# Patient Record
Sex: Female | Born: 1985 | Hispanic: No | Marital: Married | Smoking: Former smoker
Health system: Southern US, Community
[De-identification: ages and names within clinical notes are randomized; demographics above are authoritative.]

## PROBLEM LIST (undated history)

## (undated) DIAGNOSIS — Z789 Other specified health status: Secondary | ICD-10-CM

## (undated) DIAGNOSIS — L709 Acne, unspecified: Secondary | ICD-10-CM

## (undated) DIAGNOSIS — O1403 Mild to moderate pre-eclampsia, third trimester: Secondary | ICD-10-CM

## (undated) HISTORY — DX: Acne, unspecified: L70.9

## (undated) HISTORY — PX: NO PAST SURGERIES: SHX2092

## (undated) HISTORY — PX: WISDOM TOOTH EXTRACTION: SHX21

---

## 2017-08-18 LAB — OB RESULTS CONSOLE ABO/RH: RH Type: POSITIVE

## 2017-08-18 LAB — OB RESULTS CONSOLE RUBELLA ANTIBODY, IGM: RUBELLA: IMMUNE

## 2017-08-18 LAB — OB RESULTS CONSOLE GC/CHLAMYDIA
CHLAMYDIA, DNA PROBE: NEGATIVE
GC PROBE AMP, GENITAL: NEGATIVE

## 2017-08-18 LAB — OB RESULTS CONSOLE HIV ANTIBODY (ROUTINE TESTING): HIV: NONREACTIVE

## 2017-08-18 LAB — OB RESULTS CONSOLE ANTIBODY SCREEN: Antibody Screen: NEGATIVE

## 2017-08-18 LAB — OB RESULTS CONSOLE RPR: RPR: NONREACTIVE

## 2017-08-18 LAB — OB RESULTS CONSOLE HEPATITIS B SURFACE ANTIGEN: HEP B S AG: NEGATIVE

## 2017-09-08 ENCOUNTER — Other Ambulatory Visit (HOSPITAL_COMMUNITY): Payer: Self-pay | Admitting: Obstetrics & Gynecology

## 2017-09-08 DIAGNOSIS — Q798 Other congenital malformations of musculoskeletal system: Secondary | ICD-10-CM

## 2017-09-08 DIAGNOSIS — Z3689 Encounter for other specified antenatal screening: Secondary | ICD-10-CM

## 2017-09-08 DIAGNOSIS — Z3A15 15 weeks gestation of pregnancy: Secondary | ICD-10-CM

## 2017-09-20 ENCOUNTER — Encounter (HOSPITAL_COMMUNITY): Payer: Self-pay | Admitting: Obstetrics & Gynecology

## 2017-09-28 ENCOUNTER — Ambulatory Visit (HOSPITAL_COMMUNITY)
Admission: RE | Admit: 2017-09-28 | Discharge: 2017-09-28 | Disposition: A | Discharge status: Home / Self Care | Payer: BLUE CROSS/BLUE SHIELD | Source: Ambulatory Visit | Attending: Obstetrics & Gynecology | Admitting: Obstetrics & Gynecology

## 2017-09-28 ENCOUNTER — Encounter (HOSPITAL_COMMUNITY): Payer: Self-pay | Admitting: *Deleted

## 2017-09-28 DIAGNOSIS — Z3A16 16 weeks gestation of pregnancy: Secondary | ICD-10-CM | POA: Diagnosis not present

## 2017-09-28 DIAGNOSIS — O418X20 Other specified disorders of amniotic fluid and membranes, second trimester, not applicable or unspecified: Secondary | ICD-10-CM | POA: Diagnosis not present

## 2017-09-28 DIAGNOSIS — Z3A15 15 weeks gestation of pregnancy: Secondary | ICD-10-CM

## 2017-09-28 DIAGNOSIS — Z3689 Encounter for other specified antenatal screening: Secondary | ICD-10-CM

## 2017-09-28 DIAGNOSIS — Z363 Encounter for antenatal screening for malformations: Secondary | ICD-10-CM | POA: Insufficient documentation

## 2017-09-28 DIAGNOSIS — O418X90 Other specified disorders of amniotic fluid and membranes, unspecified trimester, not applicable or unspecified: Secondary | ICD-10-CM | POA: Diagnosis present

## 2017-09-28 DIAGNOSIS — Q798 Other congenital malformations of musculoskeletal system: Secondary | ICD-10-CM

## 2017-09-28 HISTORY — DX: Other specified health status: Z78.9

## 2017-10-05 ENCOUNTER — Other Ambulatory Visit: Payer: Self-pay

## 2017-10-05 ENCOUNTER — Encounter (HOSPITAL_COMMUNITY): Payer: Self-pay

## 2017-11-22 NOTE — L&D Delivery Note (Signed)
Operative Delivery Note Pushing well but not able to get past +3/5.  Verbal consent: obtained from patient for Vacuum assisted vaginal birth.  Risks and benefits discussed in detail.  Risks include, but are not limited to the risks of anesthesia, bleeding, infection, damage to maternal tissues, fetal cephalhematoma.  There is also the risk of inability to effect vaginal delivery of the head, or shoulder dystocia that cannot be resolved by established maneuvers, leading to the need for emergency cesarean section.  Kiwi used. 2 applications. 1st with 4 pushes and 2nd with 2 pushes and completed head delivery. No pop-offs.  At 2:21 AM a healthy viable female was delivered via Vaginal, Kiwi Vacuum Investment banker, operational(Extractor).  Presentation: vertex; Position: Occiput,, Anterior; Station: +3. Vacum released and baby placed on mother. Delayed cord clamping done at 1 minute.   APGAR: 9, 9; weight -pending  .   Placenta status: spontaneous, complete , .   Cord:  with the following complications: None  .  Cord pH: not sent.   Anesthesia:  Epidural  Instruments: Kiwi Episiotomy: Left Mediolateral Lacerations: 2nd degree Suture Repair: 3.0 vicryl rapide Est. Blood Loss (mL):  600 cc  Mom to postpartum.  Baby to Couplet care / Skin to Skin.  Sarah Hodge 02/26/2018, 3:00 AM

## 2018-02-14 LAB — OB RESULTS CONSOLE GBS: GBS: NEGATIVE

## 2018-02-24 ENCOUNTER — Inpatient Hospital Stay (HOSPITAL_COMMUNITY)
Admission: AD | Admit: 2018-02-24 | Discharge: 2018-02-28 | DRG: 806 | Disposition: A | Discharge status: Home / Self Care | Payer: BLUE CROSS/BLUE SHIELD | Source: Ambulatory Visit | Attending: Obstetrics & Gynecology | Admitting: Obstetrics & Gynecology

## 2018-02-24 ENCOUNTER — Encounter (HOSPITAL_COMMUNITY): Payer: Self-pay | Admitting: Obstetrics & Gynecology

## 2018-02-24 DIAGNOSIS — Z8759 Personal history of other complications of pregnancy, childbirth and the puerperium: Secondary | ICD-10-CM

## 2018-02-24 DIAGNOSIS — O1403 Mild to moderate pre-eclampsia, third trimester: Secondary | ICD-10-CM

## 2018-02-24 DIAGNOSIS — O9081 Anemia of the puerperium: Secondary | ICD-10-CM | POA: Diagnosis not present

## 2018-02-24 DIAGNOSIS — O1404 Mild to moderate pre-eclampsia, complicating childbirth: Secondary | ICD-10-CM | POA: Diagnosis present

## 2018-02-24 DIAGNOSIS — Z3A37 37 weeks gestation of pregnancy: Secondary | ICD-10-CM

## 2018-02-24 DIAGNOSIS — D62 Acute posthemorrhagic anemia: Secondary | ICD-10-CM | POA: Diagnosis not present

## 2018-02-24 HISTORY — DX: Mild to moderate pre-eclampsia, third trimester: O14.03

## 2018-02-24 LAB — CBC
HEMATOCRIT: 38 % (ref 36.0–46.0)
HEMOGLOBIN: 12.6 g/dL (ref 12.0–15.0)
MCH: 27 pg (ref 26.0–34.0)
MCHC: 33.2 g/dL (ref 30.0–36.0)
MCV: 81.4 fL (ref 78.0–100.0)
Platelets: 193 10*3/uL (ref 150–400)
RBC: 4.67 MIL/uL (ref 3.87–5.11)
RDW: 14.9 % (ref 11.5–15.5)
WBC: 11.9 10*3/uL — AB (ref 4.0–10.5)

## 2018-02-24 LAB — COMPREHENSIVE METABOLIC PANEL
ALBUMIN: 2.7 g/dL — AB (ref 3.5–5.0)
ALT: 12 U/L — ABNORMAL LOW (ref 14–54)
ANION GAP: 10 (ref 5–15)
AST: 19 U/L (ref 15–41)
Alkaline Phosphatase: 208 U/L — ABNORMAL HIGH (ref 38–126)
BUN: 8 mg/dL (ref 6–20)
CO2: 18 mmol/L — ABNORMAL LOW (ref 22–32)
Calcium: 8.7 mg/dL — ABNORMAL LOW (ref 8.9–10.3)
Chloride: 106 mmol/L (ref 101–111)
Creatinine, Ser: 0.47 mg/dL (ref 0.44–1.00)
GFR calc Af Amer: >60 mL/min (ref >60)
GFR calc non Af Amer: >60 mL/min (ref >60)
GLUCOSE: 106 mg/dL — AB (ref 65–99)
POTASSIUM: 4.1 mmol/L (ref 3.5–5.1)
Sodium: 134 mmol/L — ABNORMAL LOW (ref 135–145)
Total Bilirubin: 0.4 mg/dL (ref 0.3–1.2)
Total Protein: 6.6 g/dL (ref 6.5–8.1)

## 2018-02-24 LAB — ABO/RH: ABO/RH(D): B POS

## 2018-02-24 LAB — TYPE AND SCREEN
ABO/RH(D): B POS
ANTIBODY SCREEN: NEGATIVE

## 2018-02-24 MED ORDER — LACTATED RINGERS IV SOLN
INTRAVENOUS | Status: DC
  Administered 2018-02-24 (×2): via INTRAVENOUS
  Administered 2018-02-25: 200 mL via INTRAVENOUS
  Administered 2018-02-25 (×3): via INTRAVENOUS

## 2018-02-24 MED ORDER — MISOPROSTOL 25 MCG QUARTER TABLET
25.0000 ug | ORAL_TABLET | ORAL | Status: AC | PRN
  Administered 2018-02-24 (×2): 25 ug via VAGINAL
  Filled 2018-02-24 (×2): qty 1

## 2018-02-24 MED ORDER — SOD CITRATE-CITRIC ACID 500-334 MG/5ML PO SOLN: 30.0000 mL | ORAL | Status: DC | PRN

## 2018-02-24 MED ORDER — ACETAMINOPHEN 325 MG PO TABS: 650.0000 mg | ORAL_TABLET | ORAL | Status: DC | PRN

## 2018-02-24 MED ORDER — OXYTOCIN 40 UNITS IN LACTATED RINGERS INFUSION - SIMPLE MED: 2.5000 [IU]/h | INTRAVENOUS | Status: DC

## 2018-02-24 MED ORDER — TERBUTALINE SULFATE 1 MG/ML IJ SOLN
0.2500 mg | Freq: Once | INTRAMUSCULAR | Status: DC | PRN
  Filled 2018-02-24: qty 1

## 2018-02-24 MED ORDER — OXYCODONE-ACETAMINOPHEN 5-325 MG PO TABS: 1.0000 | ORAL_TABLET | ORAL | Status: DC | PRN

## 2018-02-24 MED ORDER — BUTORPHANOL TARTRATE 1 MG/ML IJ SOLN
1.0000 mg | INTRAMUSCULAR | Status: DC | PRN
  Administered 2018-02-24: 1 mg via INTRAVENOUS
  Filled 2018-02-24: qty 1

## 2018-02-24 MED ORDER — LIDOCAINE HCL (PF) 1 % IJ SOLN
30.0000 mL | INTRAMUSCULAR | Status: AC | PRN
  Administered 2018-02-26: 30 mL via SUBCUTANEOUS
  Filled 2018-02-24: qty 30

## 2018-02-24 MED ORDER — LACTATED RINGERS IV SOLN: 500.0000 mL | INTRAVENOUS | Status: DC | PRN

## 2018-02-24 MED ORDER — OXYCODONE-ACETAMINOPHEN 5-325 MG PO TABS: 2.0000 | ORAL_TABLET | ORAL | Status: DC | PRN

## 2018-02-24 MED ORDER — ZOLPIDEM TARTRATE 5 MG PO TABS
5.0000 mg | ORAL_TABLET | Freq: Every evening | ORAL | Status: DC | PRN
  Administered 2018-02-24: 5 mg via ORAL
  Filled 2018-02-24: qty 1

## 2018-02-24 MED ORDER — OXYTOCIN BOLUS FROM INFUSION: 500.0000 mL | Freq: Once | INTRAVENOUS | Status: DC

## 2018-02-24 MED ORDER — OXYTOCIN 40 UNITS IN LACTATED RINGERS INFUSION - SIMPLE MED
1.0000 m[IU]/min | INTRAVENOUS | Status: DC
  Administered 2018-02-25: 2 m[IU]/min via INTRAVENOUS
  Filled 2018-02-24: qty 1000

## 2018-02-24 MED ORDER — ONDANSETRON HCL 4 MG/2ML IJ SOLN: 4.0000 mg | Freq: Four times a day (QID) | INTRAMUSCULAR | Status: DC | PRN

## 2018-02-24 NOTE — H&P (Signed)
Sarah Hodge is a 32 y.o. female G1, 37.5 wks by 1st trim sono. Admitted for labor induction for mild preeclampsia.  BP elevated this wk, 140s/ 90s, facial and had swelling. No HAs or RUQ/epig pain, wt is stable. PIH labs- normal but Urine p/c ratio 0.43 in office yesterday. Since BP still elevated today. Recommend IOL and agrees.   Overall uncomplicated pregnancy except amniotic band noted at 13 wk sono, she had normal anatomy, normal MFM sono and consult and normal growth at 34 wks - 5'13" 66%, BPD 92%, FL 82%, AC 42%   OB History    Gravida  1   Para  0   Term  0   Preterm  0   AB  0   Living  0     SAB  0   TAB  0   Ectopic  0   Multiple  0   Live Births  0          Past Medical History:  Diagnosis Date  . Antepartum mild preeclampsia, third trimester 02/24/2018  . Medical history non-contributory    Past Surgical History:  Procedure Laterality Date  . NO PAST SURGERIES     Family History: family history is not on file. Social History:  reports that she has never smoked. She has never used smokeless tobacco. She reports that she does not drink alcohol or use drugs.     Maternal Diabetes: No Genetic Screening: Normal  Ultrascreen neg, AFP1 normal  Maternal Ultrasounds/Referrals: Normal anatomy, normal growth at 34 wks, Amniotic band noted at 13 wks was not seen at MFM sono in 2nd trimester Fetal Ultrasounds or other Referrals:  Referred to Materal Fetal Medicine  Maternal Substance Abuse:  No Significant Maternal Medications:  None Significant Maternal Lab Results:  Lab values include: Group B Strep negative Other Comments:  None  ROSswelling. No HAs History   Blood pressure (!) 143/96, pulse 97, last menstrual period 05/31/2017. Exam Physical Exam  Physical exam:  A&O x 3, no acute distress. Pleasant HEENT neg, no thyromegaly Lungs CTA bilat CV RRR, S1S2 normal Abdo soft, non tender, non acute Extr no edema/ tenderness DTR +2 Pelvic closed, long  cx, Vx, high station -4 FHT 135/ + accels/ no decels/ mod variab - cat I Toco none  Prenatal labs: ABO, Rh: B/Positive/-- (09/27 0000) Antibody: Negative (09/27 0000) Rubella: Immune (09/27 0000) RPR: Nonreactive (09/27 0000)  HBsAg: Negative (09/27 0000)  HIV: Non-reactive (09/27 0000)  GBS: Negative (03/26 0000)   Assessment/Plan: 32 yo G1, 37.5 wks, mild PEC. Admitted for IOL, cytotec, 2 doses every 4 hrs as needed. Ok to ambulate, general diet now.  FHT- category I  Labor pain mngmt reviewed, desires epidural in active labor.  PIH labs.  Sarah Hodge 02/24/2018, 1:01 PM

## 2018-02-24 NOTE — Progress Notes (Signed)
Patient ID: [name], female   DOB: 1986/02/03, 32 y.o.   MRN: [id number] IOL 37/5 wks for mild preeclampsia. Labs nl, urine p/c in office 0.43 yesterday S/p Cytotec 25mcg vaginal 2 doses.   BP (!) 130/95   Pulse (!) 101   Temp 98.2 F (36.8 C)   Resp 17   Ht 5\' 8"  (1.727 m)   Wt 189 lb (85.7 kg)   LMP 05/31/2017   SpO2 97%   BMI 28.74 kg/m   FHT 135/ + accels/ no decels/ mod variab- cat I UCs   2-3 mins   SVE 2/ 50%./ -5. Vx.  Pelvis? Borderline   Cervical Foley placed, 30 cc balloon. Tolerated well.   Overnight Ambien, stadol as needed for pain. Epidural ok  Clear liquid once starts to get painful contractions.

## 2018-02-24 NOTE — Anesthesia Pain Management Evaluation Note (Signed)
  CRNA Pain Management Visit Note  Patient: Sarah Hodge, 32 y.o., female  "Hello I am a member of the anesthesia team at [facility]. We have an anesthesia team available at all times to provide care throughout the hospital, including epidural management and anesthesia for C-section. I don't know your plan for the delivery whether it a natural birth, water birth, IV sedation, nitrous supplementation, doula or epidural, but we want to meet your pain goals."   1.Was your pain managed to your expectations on prior hospitalizations?   Yes   2.What is your expectation for pain management during this hospitalization?     Epidural  3.How can we help you reach that goal? Support prn  Record the patient's initial score and the patient's pain goal.   Pain: 0  Pain Goal: 4 [facility] wants you to be able to say your pain was always managed very well.  [facility],Elder Davidian 02/24/2018

## 2018-02-25 ENCOUNTER — Inpatient Hospital Stay (HOSPITAL_COMMUNITY): Payer: BLUE CROSS/BLUE SHIELD | Admitting: Anesthesiology

## 2018-02-25 ENCOUNTER — Encounter (HOSPITAL_COMMUNITY): Payer: Self-pay | Admitting: Emergency Medicine

## 2018-02-25 LAB — RPR: RPR Ser Ql: NONREACTIVE

## 2018-02-25 LAB — CBC
HCT: 37.7 % (ref 36.0–46.0)
HCT: 38.8 % (ref 36.0–46.0)
Hemoglobin: 12.4 g/dL (ref 12.0–15.0)
Hemoglobin: 12.9 g/dL (ref 12.0–15.0)
MCH: 26.7 pg (ref 26.0–34.0)
MCH: 27 pg (ref 26.0–34.0)
MCHC: 32.9 g/dL (ref 30.0–36.0)
MCHC: 33.2 g/dL (ref 30.0–36.0)
MCV: 81.3 fL (ref 78.0–100.0)
MCV: 81.3 fL (ref 78.0–100.0)
PLATELETS: 178 10*3/uL (ref 150–400)
Platelets: 165 10*3/uL (ref 150–400)
RBC: 4.64 MIL/uL (ref 3.87–5.11)
RBC: 4.77 MIL/uL (ref 3.87–5.11)
RDW: 15 % (ref 11.5–15.5)
RDW: 14.9 % (ref 11.5–15.5)
WBC: 13.6 10*3/uL — ABNORMAL HIGH (ref 4.0–10.5)
WBC: 14.1 10*3/uL — AB (ref 4.0–10.5)

## 2018-02-25 MED ORDER — LACTATED RINGERS IV SOLN
500.0000 mL | Freq: Once | INTRAVENOUS | Status: AC
  Administered 2018-02-25: 500 mL via INTRAVENOUS

## 2018-02-25 MED ORDER — EPHEDRINE 5 MG/ML INJ
10.0000 mg | INTRAVENOUS | Status: DC | PRN
  Filled 2018-02-25: qty 2

## 2018-02-25 MED ORDER — PHENYLEPHRINE 40 MCG/ML (10ML) SYRINGE FOR IV PUSH (FOR BLOOD PRESSURE SUPPORT)
80.0000 ug | PREFILLED_SYRINGE | INTRAVENOUS | Status: DC | PRN
  Filled 2018-02-25: qty 10
  Filled 2018-02-25: qty 5

## 2018-02-25 MED ORDER — PHENYLEPHRINE 40 MCG/ML (10ML) SYRINGE FOR IV PUSH (FOR BLOOD PRESSURE SUPPORT)
80.0000 ug | PREFILLED_SYRINGE | INTRAVENOUS | Status: DC | PRN
  Filled 2018-02-25: qty 5

## 2018-02-25 MED ORDER — LIDOCAINE HCL (PF) 1 % IJ SOLN
INTRAMUSCULAR | Status: DC | PRN
  Administered 2018-02-25 (×2): 6 mL via EPIDURAL

## 2018-02-25 MED ORDER — LIDOCAINE-EPINEPHRINE (PF) 2 %-1:200000 IJ SOLN
INTRAMUSCULAR | Status: DC | PRN
  Administered 2018-02-25: 5 mL via EPIDURAL
  Administered 2018-02-25: 2 mL via EPIDURAL

## 2018-02-25 MED ORDER — FENTANYL 2.5 MCG/ML BUPIVACAINE 1/10 % EPIDURAL INFUSION (WH - ANES)
14.0000 mL/h | INTRAMUSCULAR | Status: DC | PRN
  Administered 2018-02-25 (×2): 14 mL/h via EPIDURAL
  Filled 2018-02-25 (×2): qty 100

## 2018-02-25 MED ORDER — DIPHENHYDRAMINE HCL 50 MG/ML IJ SOLN: 12.5000 mg | INTRAMUSCULAR | Status: DC | PRN

## 2018-02-25 NOTE — Progress Notes (Signed)
Patient ID: Sarah MortSonam Hodge, female   DOB: 22-Apr-1986, 32 y.o.   MRN: [id number]  Contractions slightly stronger. May request epidural   BP (!) 148/101   Pulse (!) 103   Temp 98.5 F (36.9 C) (Oral)   Resp 18   Ht 5\' 8"  (1.727 m)   Wt 189 lb (85.7 kg)   LMP 05/31/2017   SpO2 97%   BMI 28.74 kg/m   FHT 140s/ + accels/ no decels/ mod variability-catergoryI  Toco  2-3 min, now on pitocin  Posterior cervix, cant reach os, station higher (ballotable now, it was not ballotable when cervical balloon was placed)   Epidural per request . Will attempt AROM again if head well applied or lower   V.Juliene PinaMody, MD

## 2018-02-25 NOTE — Progress Notes (Signed)
Patient ID: Sarah Hodge, female   DOB: 1985-12-25, 32 y.o.   MRN: [id number]  BP (!) 145/98   Pulse 100   Temp 98.2 F (36.8 C) (Axillary)   Resp 18   Ht 5\' 8"  (1.727 m)   Wt 189 lb (85.7 kg)   LMP 05/31/2017   SpO2 97%   BMI 28.74 kg/m  FHT 140s/ + accels/ no decels/ mod variability-catergoryI  Toco   2-3 min, now on pitocin  Cervical balloon expelled, 4 cn/ 50%/ high station per RN CBC now. Epidural per request

## 2018-02-25 NOTE — Progress Notes (Signed)
Sarah Hodge is a 32 y.o. G1P0000 at 339w6d by 1st trim ultrasound admitted for IOL for Mild Preeclampsia   Subjective: Feels well with epidural   Objective: BP (!) 147/97   Pulse (!) 106   Temp 98 F (36.7 C) (Oral)   Resp 18   Ht 5\' 8"  (1.727 m)   Wt 189 lb (85.7 kg)   LMP 05/31/2017   SpO2 97%   BMI 28.74 kg/m    FHT:  FHR: 145 bpm, variability: moderate,  accelerations:  Present,  decelerations:  Absent UC:   regular, every 3 minutes SVE:   Dilation: 5.5 Effacement (%): 60 Station: Ballotable Exam by:: Sarah Hodge Controlled AROM, clear large amount of fluid. Once fluid released, head was well approximated to cervix.   Labs: Lab Results  Component Value Date   WBC 14.1 (H) 02/25/2018   HGB 12.9 02/25/2018   HCT 38.8 02/25/2018   MCV 81.3 02/25/2018   PLT 178 02/25/2018    Assessment / Plan: Induction of labor due to preeclampsia,  progressing well on pitocin  Labor: Progressing normally though fetal descent not present  Preeclampsia:  no signs or symptoms of toxicity, intake and ouput balanced, labs stable and will give magnesium pp Fetal Wellbeing:  Category I Pain Control:  Epidural I/D:  n/a Anticipated MOD:  guarded, working towards vaginal delivery  Sarah Hodge 02/25/2018, 1:17 PM

## 2018-02-25 NOTE — Anesthesia Procedure Notes (Signed)
Epidural Patient location during procedure: OB Start time: 02/25/2018 11:14 AM End time: 02/25/2018 11:17 AM  Staffing Anesthesiologist: Leilani AbleHatchett, Zymarion Favorite, MD Performed: anesthesiologist   Preanesthetic Checklist Completed: patient identified, site marked, surgical consent, pre-op evaluation, timeout performed, IV checked, risks and benefits discussed and monitors and equipment checked  Epidural Patient position: sitting Prep: site prepped and draped and DuraPrep Patient monitoring: continuous pulse ox and blood pressure Approach: midline Location: L3-L4 Injection technique: LOR air  Needle:  Needle type: Tuohy  Needle gauge: 17 G Needle length: 9 cm and 9 Needle insertion depth: 5 cm cm Catheter type: closed end flexible Catheter size: 19 Gauge Catheter at skin depth: 10 cm Test dose: negative and Other  Assessment Sensory level: T9 Events: blood not aspirated, injection not painful, no injection resistance, negative IV test and no paresthesia  Additional Notes Reason for block:procedure for pain

## 2018-02-25 NOTE — Progress Notes (Signed)
Sarah Hodge is a 32 y.o. G1P0000 at 5538w6d by 1st trim ultrasound admitted for IOL for Mild Preeclampsia   Subjective: Feels well but tired now since in house since 12 noon yesterday. Lengthy IOL process was reviewed and d/w pt again.  Denies HA/ vision issues/ epig pain  Objective: BP 134/84   Pulse (!) 111   Temp 99.3 F (37.4 C) (Axillary)   Resp 20   Ht 5\' 8"  (1.727 m)   Wt 189 lb (85.7 kg)   LMP 05/31/2017   SpO2 97%   BMI 28.74 kg/m  BPs stable FHT:  FHR: 145 bpm, variability: moderate,  accelerations:  Present,  decelerations:  Absent UC:   regular, every 3 minutes SVE:   Dilation: 8 Effacement (%): 100 Station: -3 Exam by:: Jerrol Helmers MD Head was well approximated to cervix.  Continue to closely reassess but there is improvement   Assessment / Plan: Induction of labor due to preeclampsia,  progressing well on pitocin  Labor: Progressing normally. Hoping for fetal descent as she dilates further. IUPC deferred since making good progress.  Preeclampsia:  no signs or symptoms of toxicity, intake and ouput balanced, labs stable and will give magnesium pp Fetal Wellbeing:  Category I Pain Control:  Epidural I/D:  n/a Anticipated MOD:  guarded, working towards vaginal delivery  Sarah Hodge 02/25/2018, 9:06 PM

## 2018-02-25 NOTE — Progress Notes (Signed)
Patient ID: [name], female   DOB: [birth year], 32 y.o.   MRN: [id number] CTSP for pain on right side, epidural PCA not helping anymore  SVE complete/ Vx/-2  Feels tight fit.   Reduce pitocin, anesthesia rebolus if option and reassess

## 2018-02-25 NOTE — Anesthesia Preprocedure Evaluation (Signed)
Anesthesia Evaluation  Patient identified by MRN, date of birth, ID band Patient awake    Reviewed: Allergy & Precautions, H&P , NPO status , Patient's Chart, lab work & pertinent test results  Airway Mallampati: I  TM Distance: >3 FB Neck ROM: full    Dental no notable dental hx. (+) Teeth Intact   Pulmonary neg pulmonary ROS,    Pulmonary exam normal breath sounds clear to auscultation       Cardiovascular hypertension, negative cardio ROS Normal cardiovascular exam Rhythm:regular Rate:Normal     Neuro/Psych negative neurological ROS  negative psych ROS   GI/Hepatic negative GI ROS, Neg liver ROS,   Endo/Other  negative endocrine ROS  Renal/GU negative Renal ROS  negative genitourinary   Musculoskeletal negative musculoskeletal ROS (+)   Abdominal Normal abdominal exam  (+)   Peds  Hematology negative hematology ROS (+)   Anesthesia Other Findings   Reproductive/Obstetrics (+) Pregnancy                             Anesthesia Physical Anesthesia Plan  ASA: II  Anesthesia Plan: Epidural   Post-op Pain Management:    Induction:   PONV Risk Score and Plan:   Airway Management Planned:   Additional Equipment:   Intra-op Plan:   Post-operative Plan:   Informed Consent: I have reviewed the patients History and Physical, chart, labs and discussed the procedure including the risks, benefits and alternatives for the proposed anesthesia with the patient or authorized representative who has indicated his/her understanding and acceptance.     Plan Discussed with:   Anesthesia Plan Comments:         Anesthesia Quick Evaluation

## 2018-02-26 ENCOUNTER — Encounter (HOSPITAL_COMMUNITY): Payer: Self-pay | Admitting: *Deleted

## 2018-02-26 ENCOUNTER — Other Ambulatory Visit: Payer: Self-pay

## 2018-02-26 DIAGNOSIS — Z8759 Personal history of other complications of pregnancy, childbirth and the puerperium: Secondary | ICD-10-CM

## 2018-02-26 LAB — CBC
HEMATOCRIT: 36.2 % (ref 36.0–46.0)
HEMOGLOBIN: 12.1 g/dL (ref 12.0–15.0)
MCH: 26.9 pg (ref 26.0–34.0)
MCHC: 33.4 g/dL (ref 30.0–36.0)
MCV: 80.6 fL (ref 78.0–100.0)
Platelets: 184 10*3/uL (ref 150–400)
RBC: 4.49 MIL/uL (ref 3.87–5.11)
RDW: 14.8 % (ref 11.5–15.5)
WBC: 21.4 10*3/uL — AB (ref 4.0–10.5)

## 2018-02-26 MED ORDER — FERROUS SULFATE 325 (65 FE) MG PO TABS
325.0000 mg | ORAL_TABLET | Freq: Two times a day (BID) | ORAL | Status: DC
Start: 2018-02-26 — End: 2018-02-28
  Administered 2018-02-26 – 2018-02-28 (×5): 325 mg via ORAL
  Filled 2018-02-26 (×5): qty 1

## 2018-02-26 MED ORDER — COCONUT OIL OIL
1.0000 "application " | TOPICAL_OIL | Status: DC | PRN
  Administered 2018-02-28: 1 via TOPICAL
  Filled 2018-02-26: qty 120

## 2018-02-26 MED ORDER — OXYCODONE-ACETAMINOPHEN 5-325 MG PO TABS: 1.0000 | ORAL_TABLET | ORAL | Status: DC | PRN

## 2018-02-26 MED ORDER — LACTATED RINGERS IV SOLN
INTRAVENOUS | Status: DC
  Administered 2018-02-26 – 2018-02-27 (×3): via INTRAVENOUS

## 2018-02-26 MED ORDER — SENNOSIDES-DOCUSATE SODIUM 8.6-50 MG PO TABS
2.0000 | ORAL_TABLET | ORAL | Status: DC
  Administered 2018-02-27 (×2): 2 via ORAL
  Filled 2018-02-26 (×2): qty 2

## 2018-02-26 MED ORDER — ONDANSETRON HCL 4 MG/2ML IJ SOLN: 4.0000 mg | INTRAMUSCULAR | Status: DC | PRN

## 2018-02-26 MED ORDER — MAGNESIUM SULFATE BOLUS VIA INFUSION
4.0000 g | Freq: Once | INTRAVENOUS | Status: AC
  Administered 2018-02-26: 4 g via INTRAVENOUS
  Filled 2018-02-26: qty 500

## 2018-02-26 MED ORDER — TETANUS-DIPHTH-ACELL PERTUSSIS 5-2.5-18.5 LF-MCG/0.5 IM SUSP: 0.5000 mL | Freq: Once | INTRAMUSCULAR | Status: DC

## 2018-02-26 MED ORDER — SIMETHICONE 80 MG PO CHEW
80.0000 mg | CHEWABLE_TABLET | ORAL | Status: DC | PRN
Start: 2018-02-26 — End: 2018-02-28

## 2018-02-26 MED ORDER — WITCH HAZEL-GLYCERIN EX PADS
1.0000 | MEDICATED_PAD | CUTANEOUS | Status: DC | PRN
Start: 2018-02-26 — End: 2018-02-28
  Administered 2018-02-26: 1 via TOPICAL

## 2018-02-26 MED ORDER — DIPHENHYDRAMINE HCL 25 MG PO CAPS: 25.0000 mg | ORAL_CAPSULE | Freq: Four times a day (QID) | ORAL | Status: DC | PRN

## 2018-02-26 MED ORDER — IBUPROFEN 600 MG PO TABS
600.0000 mg | ORAL_TABLET | Freq: Four times a day (QID) | ORAL | Status: DC
  Administered 2018-02-26 – 2018-02-28 (×9): 600 mg via ORAL
  Filled 2018-02-26 (×9): qty 1

## 2018-02-26 MED ORDER — PRENATAL MULTIVITAMIN CH
1.0000 | ORAL_TABLET | Freq: Every day | ORAL | Status: DC
  Administered 2018-02-26 – 2018-02-27 (×2): 1 via ORAL
  Filled 2018-02-26 (×2): qty 1

## 2018-02-26 MED ORDER — MAGNESIUM SULFATE 40 G IN LACTATED RINGERS - SIMPLE
2.0000 g/h | INTRAVENOUS | Status: AC
  Administered 2018-02-27: 2 g/h via INTRAVENOUS
  Filled 2018-02-26: qty 500
  Filled 2018-02-26: qty 40

## 2018-02-26 MED ORDER — ACETAMINOPHEN 325 MG PO TABS: 650.0000 mg | ORAL_TABLET | ORAL | Status: DC | PRN

## 2018-02-26 MED ORDER — ONDANSETRON HCL 4 MG PO TABS: 4.0000 mg | ORAL_TABLET | ORAL | Status: DC | PRN

## 2018-02-26 MED ORDER — BENZOCAINE-MENTHOL 20-0.5 % EX AERO
1.0000 "application " | INHALATION_SPRAY | CUTANEOUS | Status: DC | PRN
  Administered 2018-02-26: 1 via TOPICAL
  Filled 2018-02-26: qty 56

## 2018-02-26 MED ORDER — DIBUCAINE 1 % RE OINT
1.0000 "application " | TOPICAL_OINTMENT | RECTAL | Status: DC | PRN
  Administered 2018-02-26: 1 via RECTAL
  Filled 2018-02-26: qty 28

## 2018-02-26 NOTE — Progress Notes (Signed)
   02/26/18 1028 02/26/18 1118 02/26/18 1328  Vital Signs  BP (!) 157/105 (!) 133/99 (!) 149/102  Dr Juliene PinaMody called & report given re: above Bps & times & most recent PIH assessment.  Order's rec'd to transfer pt for magnesium admin.  Discussed POC with pt & husband re indication, action, side effects & frequency of magnesium sulfate IV.  Questions answered & both verb understanding.

## 2018-02-26 NOTE — Lactation Note (Signed)
This note was copied from a baby's chart. Lactation Consultation Note  Patient Name: Sarah Hodge XWRUE'AToday's Date: 02/26/2018 Reason for consult: Initial assessment;Primapara;1st time breastfeeding;Other (Comment)(mom is on MAg SO4 - started at 1540 , )  Baby is 3614 hours old and has been to the breast several times.  As LC entered the room the RN had assisted baby to latch with depth.  LC noted flanged lips, swallows, increased with breast compressions.  Baby fed for 15 mins, nipple well rounded when baby released.  LC mentioned to mom, dad and RN if intermittent pinching continues  To instruct mom on shells. LC also encouraged mom prior to latch - breast  Massage, hand express, reverse pressure.  Mother informed of post-discharge support and given phone number to the lactation department, including services for phone call assistance; out-patient appointments; and breastfeeding support group. List of other breastfeeding resources in the community given in the handout. Encouraged mother to call for problems or concerns related to breastfeeding.   Maternal Data Has patient been taught Hand Expression?: Yes Does the patient have breastfeeding experience prior to this delivery?: No  Feeding Feeding Type: (baby latched as LC entered the room - with depth , swallows noted ) Length of feed: 20 min(swallows noted, increased with breast compressions )  LATCH Score Latch: (latched with depth )  Audible Swallowing: (swallows, increased with breast compressions )  Type of Nipple: (nipple well rounded when baby released )  Comfort (Breast/Nipple): (per mom aliitle pinching intermittent )  Hold (Positioning): (RN assisted with latch )  LATCH Score: 7  Interventions Interventions: Breast feeding basics reviewed;Assisted with latch;Skin to skin;Breast massage;Breast compression;Reverse pressure  Lactation Tools Discussed/Used     Consult Status Consult Status: Follow-up Date:  02/27/18 Follow-up type: In-patient    Sarah Hodge 02/26/2018, 4:29 PM

## 2018-02-26 NOTE — Progress Notes (Signed)
Patient ID: Sarah Hodge, female   DOB: [birth year], 32 y.o.   MRN: [id number] IOL for PEC.  BP (!) 159/84   Pulse (!) 125   Temp 99.7 F (37.6 C) (Oral)   Resp 18   Ht 5\' 8"  (1.727 m)   Wt 189 lb (85.7 kg)   LMP 05/31/2017   SpO2 97%   BMI 28.74 kg/m  BPs overall stable, occ high with pushing, declines symptoms.  Now complete and pushing since 12.15 am. Caput and moulding, +1 +2.  FHT slight baseline change from 145 to 155 but mod variability/ + accels/ no decels - Cat I Toco q 3 min  Small perineal body, possible episiotomy reviewed, will assess when crowning.  If brings brings the station lower, she may be a candidate for VAVD if needed  V.Juliene PinaMody, MD

## 2018-02-27 DIAGNOSIS — D62 Acute posthemorrhagic anemia: Secondary | ICD-10-CM

## 2018-02-27 LAB — COMPREHENSIVE METABOLIC PANEL
ALK PHOS: 151 U/L — AB (ref 38–126)
ALT: 15 U/L (ref 14–54)
AST: 35 U/L (ref 15–41)
Albumin: 2.3 g/dL — ABNORMAL LOW (ref 3.5–5.0)
Anion gap: 8 (ref 5–15)
BILIRUBIN TOTAL: 0.5 mg/dL (ref 0.3–1.2)
BUN: 6 mg/dL (ref 6–20)
CALCIUM: 7 mg/dL — AB (ref 8.9–10.3)
CO2: 23 mmol/L (ref 22–32)
CREATININE: 0.57 mg/dL (ref 0.44–1.00)
Chloride: 106 mmol/L (ref 101–111)
Glucose, Bld: 91 mg/dL (ref 65–99)
Potassium: 3.6 mmol/L (ref 3.5–5.1)
Sodium: 137 mmol/L (ref 135–145)
TOTAL PROTEIN: 6.1 g/dL — AB (ref 6.5–8.1)

## 2018-02-27 LAB — CBC
HEMATOCRIT: 31.8 % — AB (ref 36.0–46.0)
HEMOGLOBIN: 10.3 g/dL — AB (ref 12.0–15.0)
MCH: 26.6 pg (ref 26.0–34.0)
MCHC: 32.4 g/dL (ref 30.0–36.0)
MCV: 82.2 fL (ref 78.0–100.0)
Platelets: 179 10*3/uL (ref 150–400)
RBC: 3.87 MIL/uL (ref 3.87–5.11)
RDW: 15.3 % (ref 11.5–15.5)
WBC: 16 10*3/uL — ABNORMAL HIGH (ref 4.0–10.5)

## 2018-02-27 MED ORDER — NIFEDIPINE ER OSMOTIC RELEASE 30 MG PO TB24
30.0000 mg | ORAL_TABLET | Freq: Every day | ORAL | Status: DC
  Administered 2018-02-27 – 2018-02-28 (×2): 30 mg via ORAL
  Filled 2018-02-27 (×2): qty 1

## 2018-02-27 MED ORDER — HYDROCHLOROTHIAZIDE 12.5 MG PO CAPS
12.5000 mg | ORAL_CAPSULE | Freq: Every day | ORAL | Status: DC
  Administered 2018-02-27 – 2018-02-28 (×2): 12.5 mg via ORAL
  Filled 2018-02-27 (×2): qty 1

## 2018-02-27 NOTE — Progress Notes (Addendum)
PPD #1, VAVD, left mediolateral episiotomy repair, mild pre-eclampsia, baby boy "Sarah Hodge"  S:  Reports feeling very tired  Denies HA, RUQ/epigastric pain, reports some blurry vision since Magnesium started yesterday, but denies other visual disturbances              Tolerating po/ No nausea or vomiting / Denies dizziness, SOB, CP             Bleeding is moderate             Pain controlled with Motrin, Dermoplast spray             Up ad lib / ambulatory / voiding QS  Newborn breast feeding - going well; cluster feeding now colostrum  / Circumcision - planning today  O:               VS: BP 122/81 (BP Location: Left Arm)   Pulse (!) 111 Comment: notified nurse  Temp 98.2 F (36.8 C) (Oral)   Resp 17   Ht 5\' 8"  (1.727 m)   Wt 85.7 kg (189 lb)   LMP 05/31/2017   SpO2 100%   Breastfeeding? Unknown   BMI 28.74 kg/m   Vitals:   02/26/18 2145 02/27/18 0018 02/27/18 0315 02/27/18 0859  BP: 129/80 (!) 146/97 122/81 (!) 139/101  Pulse: (!) 125 (!) 106 (!) 111 (!) 107  Resp: 18 17 17 18   Temp:  98.1 F (36.7 C) 98.2 F (36.8 C) 97.7 F (36.5 C)  TempSrc:  Oral Oral Oral  SpO2: 100% 99% 100% 99%  Weight:      Height:         LABS:              Results for orders placed or performed during the hospital encounter of 02/24/18 (from the past 24 hour(s))  CBC     Status: Abnormal   Collection Time: 02/27/18  5:32 AM  Result Value Ref Range   WBC 16.0 (H) 4.0 - 10.5 K/uL   RBC 3.87 3.87 - 5.11 MIL/uL   Hemoglobin 10.3 (L) 12.0 - 15.0 g/dL   HCT 16.131.8 (L) 09.636.0 - 04.546.0 %   MCV 82.2 78.0 - 100.0 fL   MCH 26.6 26.0 - 34.0 pg   MCHC 32.4 30.0 - 36.0 g/dL   RDW 40.915.3 81.111.5 - 91.415.5 %   Platelets 179 150 - 400 K/uL  Comprehensive metabolic panel     Status: Abnormal   Collection Time: 02/27/18  5:32 AM  Result Value Ref Range   Sodium 137 135 - 145 mmol/L   Potassium 3.6 3.5 - 5.1 mmol/L   Chloride 106 101 - 111 mmol/L   CO2 23 22 - 32 mmol/L   Glucose, Bld 91 65 - 99 mg/dL   BUN 6 6 - 20  mg/dL   Creatinine, Ser 7.820.57 0.44 - 1.00 mg/dL   Calcium 7.0 (L) 8.9 - 10.3 mg/dL   Total Protein 6.1 (L) 6.5 - 8.1 g/dL   Albumin 2.3 (L) 3.5 - 5.0 g/dL   AST 35 15 - 41 U/L   ALT 15 14 - 54 U/L   Alkaline Phosphatase 151 (H) 38 - 126 U/L   Total Bilirubin 0.5 0.3 - 1.2 mg/dL   GFR calc non Af Amer >60 >60 mL/min   GFR calc Af Amer >60 >60 mL/min   Anion gap 8 5 - 15              Blood type: --/--/B POS (  04/05 1216)  Rubella: Immune (09/27 0000)                     I&O: Intake/Output      04/07 0701 - 04/08 0700 04/08 0701 - 04/09 0700   P.O. 240 360   I.V. (mL/kg) 1632.9 (19.1) 500 (5.8)   Total Intake(mL/kg) 1872.9 (21.9) 860 (10)   Urine (mL/kg/hr) 4650 (2.3) 1000 (6.1)   Blood     Total Output 4650 1000   Net -2777.1 -140        Net since admit: -5,992.1mL              Physical Exam:             Alert and oriented X3  Lungs: Clear and unlabored  Heart: regular rate and rhythm / no murmurs  Abdomen: soft, non-tender, non-distended              Fundus: firm, non-tender, U-E, displaced to the right (bladder full)  Perineum: well approximated left mediolateral episiotomy, no significant edema, erythema, or ecchymosis  Lochia: small, no clots   Extremities: +2 BLE edema, no calf pain or tenderness, mild facial and hand edema     A/P: PPD # 1, VAVD  Mild Pre-eclampsia    - Continue Magnesium Sulfate until 1:45pm today    - Excellent diuresis   - BPs labile, but overall improving   - Continue strict I&O   - Labs this AM stable, AST almost doubled, but still within normal range    - No neural s/s  Left Mediolateral episiotomy repair   Mild ABL Anemia    - Stable, asymptomatic on oral FE  Doing well - stable status  Routine post partum orders  Plan for circ prior to discharge   Anticipate discharge home tomorrow   Carlean Jews, MSN, CNM [facility]   Addendum:   BPs are labile, thought they were improving this morning, but has now had some  rebound elevated BPs, no neural s/s.   Begin Procardia 30mg  Xl daily HCTZ 12.5mg  daily x 5 days  D/C Magnesium at 1:45pm today   Consult for plan of care: Dr. Corky Crafts, CNM   Pt also seen. Notes fatigue and blurry vision, no HA, no RUQ pain. No CP or SOB  Vitals and I/O noted: 6895 IN/ 7525 out CV RRR Pulm: CTAB Abd" no RUQ pain, uterus NT LE: tr edema, NT, 1+ DTR, no clonus  A/P: PP, PEC, bp elevated, start procardia. D/c Mag now.  Saline lock.  Lendon Colonel 02/27/2018 1:44 PM

## 2018-02-27 NOTE — Lactation Note (Signed)
This note was copied from a baby's chart. Lactation Consultation Note  Patient Name: Boy Barnie MortSonam Gayheart KGMWN'UToday's Date: 02/27/2018 Reason for consult: Follow-up assessment;Primapara;1st time breastfeeding;Other (Comment);Nipple pain/trauma(per  mom baby down in the nursery for circ )  Per mom nipples are starting to get sore, with moms permission assessed breast tissue and noted the nipples  To both have small abrasions on both on the outer edges.  LC reviewed hand expressing, and mom was able to repeat demo with drops expressed, mom  applied to nipples.  LC recommended after breast feeding comfort gels, when warm switch to the breast shells.  ( Instructed mom on both )  Prior to latching - breast massage, hand express, pre-pump to make the nipple / areola complex more elastic for A deeper latch. LC reassured mom if she follows steps would improve.  Per mom has been using the cradle position and LC recommended switching to cross cradle to start and arms back to cradle once the baby is feeding in a consistent pattern. Or football.  LC also recommended to call for assistance on the nurses light.   Sore nipples need to be assessed tomorrow.      Maternal Data Has patient been taught Hand Expression?: Yes  Feeding    LATCH Score                   Interventions Interventions: Breast feeding basics reviewed;Hand pump;Shells;Comfort gels;Expressed milk  Lactation Tools Discussed/Used Tools: Pump Breast pump type: Manual WIC Program: No Pump Review: Setup, frequency, and cleaning Initiated by:: MAI  Date initiated:: 02/27/18   Consult Status Consult Status: Follow-up Date: 02/28/18 Follow-up type: In-patient    Matilde SprangMargaret Ann Yoshie Kosel 02/27/2018, 4:16 PM

## 2018-02-28 MED ORDER — HYDROCHLOROTHIAZIDE 12.5 MG PO CAPS: 12.5000 mg | ORAL_CAPSULE | Freq: Every day | ORAL | 0 refills | Status: DC

## 2018-02-28 MED ORDER — NIFEDIPINE ER 30 MG PO TB24: 30.0000 mg | ORAL_TABLET | Freq: Every day | ORAL | 2 refills | Status: DC

## 2018-02-28 MED ORDER — FERROUS SULFATE 325 (65 FE) MG PO TABS: 325.0000 mg | ORAL_TABLET | Freq: Two times a day (BID) | ORAL | 3 refills | Status: DC

## 2018-02-28 MED ORDER — IBUPROFEN 600 MG PO TABS: 600.0000 mg | ORAL_TABLET | Freq: Four times a day (QID) | ORAL | 0 refills | Status: DC

## 2018-02-28 NOTE — Progress Notes (Signed)
PPD #2, VAVD, left mediolateral episiotomy repair, mild pre-eclampsia, baby boy "Veer"  S:  Reports feeling much better off magnesium, still reports extreme fatigue - states baby was cluster feeding overnight and fussy when they tried to put him in the bassinet.   She denies HA, no visual disturbances - reports blurry vision resolved about 2 hours after stopping Magnesium, no RUQ/epigastric pain             Tolerating po/ No nausea or vomiting / Denies dizziness or SOB             Bleeding is lighter today, few small clots              Pain controlled with Motrin and Dermoplast spray             Up ad lib / ambulatory / voiding QS  Newborn breast feeding - reports feeling better with latching and positioning - reports sore nipples from frequent feeding, RN brought in coconut oil to use / Circumcision - completed yesterday  O:               VS: BP (!) 143/101 (BP Location: Left Arm)   Pulse 88   Temp 98 F (36.7 C) (Oral)   Resp 18   Ht 5\' 8"  (1.727 m)   Wt 85.7 kg (189 lb)   LMP 05/31/2017   SpO2 100%   Breastfeeding? Unknown   BMI 28.74 kg/m   Vitals:   02/27/18 2100 02/28/18 0100 02/28/18 0500 02/28/18 0926  BP: 122/78 (!) 129/92 (!) 125/91 (!) 143/101  Pulse: 100 (!) 103 (!) 106 88  Resp: 20 17 20 18   Temp: 98.3 F (36.8 C) 98.2 F (36.8 C) 98 F (36.7 C) 98 F (36.7 C)  TempSrc: Oral Oral Oral Oral  SpO2: 97% 99% 100% 100%  Weight:      Height:         LABS:              Recent Labs    02/26/18 0405 02/27/18 0532  WBC 21.4* 16.0*  HGB 12.1 10.3*  PLT 184 179               Blood type: --/--/B POS (04/05 1216)  Rubella: Immune (09/27 0000)                     I&O: Intake/Output      04/08 0701 - 04/09 0700 04/09 0701 - 04/10 0700   P.O. 3120    I.V. (mL/kg) 1218.8 (14.2)    Total Intake(mL/kg) 4338.8 (50.6)    Urine (mL/kg/hr) 8700 (4.2)    Stool 0    Total Output 8700    Net -4361.3         Urine Occurrence 1 x    Stool Occurrence 1 x     Net -10,  213.3 since admission               Physical Exam:             Alert and oriented X3  Lungs: Clear and unlabored  Heart: regular rate and rhythm / no murmurs  Abdomen: soft, non-tender, non-distended              Fundus: firm, non-tender, U-2  Perineum: well approximated left mediolateral episiotomy, no significant edema, erythema, or ecchymosis   Lochia: small, not clots   Extremities: trace pedal edema, +1 ankle edema, facial and hand edema improved no  calf pain or tenderness, +2DTRs, no clonus bilaterally     A/P:     PPD # 1, VAVD             Mild Pre-eclampsia                          - Off Magnesium sulfate since 4/8@ 1:45pm                          - Excellent diuresis, dependent edema improved                          - BPs labile, but overall improving on Procardia 30mg  XL, HCTZ 12.5mg  x 5 days                          - No neural s/s             Left Mediolateral episiotomy repair              Mild ABL Anemia                          - Stable, asymptomatic on oral FE             Doing well - stable status             Routine post partum orders  Discharge home today  [facility] discharge book and instructions reviewed, warning s/s reviewed  Smart start nurse for BP check in 1 week  F/u with Dr. Juliene PinaMody in 2 weeks   Consult for plan: Dr. Corky CraftsMody  Meredith Sigmon, MSN, CNM [facility]

## 2018-02-28 NOTE — Discharge Summary (Signed)
Obstetric Discharge Summary   Patient Name: Sarah MortSonam Hodge DOB: 01/12/1986 MRN: [id number]  Date of Admission: 02/24/2018 Date of Discharge: 02/28/2018 Date of Delivery: 02/26/2018 Gestational Age at Delivery: 1969w0d  Primary OB: [facility] - Dr. Juliene PinaMody  Antepartum complications:  - Mild pre-eclampsia  - Amniotic band noted at 13 week sono, normal CUS, normal growth at 34 weeks, s/p MFM consult Prenatal Labs:  ABO, Rh: B/Positive/-- (09/27 0000) Antibody: Negative (09/27 0000) Rubella: Immune (09/27 0000) RPR: Nonreactive (09/27 0000)  HBsAg: Negative (09/27 0000)  HIV: Non-reactive (09/27 0000)  GBS: Negative (03/26 0000)   Admitting Diagnosis: IOL for mild pre-eclampsia   Secondary Diagnoses: Patient Active Problem List   Diagnosis Date Noted  . Acute blood loss anemia 02/27/2018  . Status post vacuum-assisted vaginal delivery 4/7 02/26/2018  . Obstetrical laceration L medilateral episitomy 02/26/2018  . Postpartum care following VAVD (4/7) 02/26/2018  . Antepartum mild preeclampsia, third trimester 02/24/2018   Induction: Cytotec  Augmentation: Pitocin, AROM Complications: None  Date of Delivery: 02/26/2018 Delivered By: Dr. Juliene PinaMody Delivery Type: vacuum-assisted vaginal delivery Anesthesia: epidural Placenta: sponatneous Laceration: 2nd degree, no extension Episiotomy: left mediolateral   Newborn Data: Live born female  Birth Weight: 8 lb 0.6 oz (3646 g) APGAR: 9, 9  Newborn Delivery   Birth date/time:  02/26/2018 02:21:00 Delivery type:  Vaginal, Vacuum (Extractor)     Hospital/Postpartum Course  (Vaginal Delivery): Pt. Admitted for IOL for mild pr-eclampsia.  See notes for details. Patient continued to have elevated BPs, and was on Magnesium sulfate x 24 hours.  She was started on Procardia 30mg  XL and HCTZ 12.5mg  daily on 4/8.  Her BPs improved on medication, and she was asymptomatic without neural s/s.  By time of discharge on PPD#2, her pain was controlled on oral  pain medications; she had appropriate lochia and was ambulating, voiding without difficulty and tolerating regular diet.  She was deemed stable for discharge to home.    Labs: CBC Latest Ref Rng & Units 02/27/2018 02/26/2018 02/25/2018  WBC 4.0 - 10.5 K/uL 16.0(H) 21.4(H) 14.1(H)  Hemoglobin 12.0 - 15.0 g/dL 10.3(L) 12.1 12.9  Hematocrit 36.0 - 46.0 % 31.8(L) 36.2 38.8  Platelets 150 - 400 K/uL 179 184 178   B POS  Physical exam:  BP (!) 143/101 (BP Location: Left Arm) Comment: RN aware, oral BP meds due  Pulse 88   Temp 98 F (36.7 C) (Oral)   Resp 18   Ht 5\' 8"  (1.727 m)   Wt 85.7 kg (189 lb)   LMP 05/31/2017   SpO2 100%   Breastfeeding? Unknown   BMI 28.74 kg/m  General: alert and no distress Pulm: normal respiratory effort Lochia: appropriate Abdomen: soft, NT Uterine Fundus: firm, below umbilicus Perineum: healing well, no significant erythema, no significant edema Extremities: No evidence of DVT seen on physical exam.  Trace pedal and +1 ankle edema.   Disposition: stable, discharge to home Baby Feeding: breast milk Baby Disposition: home with mom  Contraception: unsure  Rh Immune globulin given: N/A Rubella vaccine given: N/A Tdap vaccine given in AP or PP setting: UTD Flu vaccine given in AP or PP setting: not on file    Plan:  Sarah Hodge was discharged to home in good condition. [facility]. Smart start nurse visit in 1 week for BP check.   Discharge Instructions: Per After Visit Summary. Refer to After Visit Summary and [facility]  Activity: Advance as tolerated. Pelvic  rest for 6 weeks.   Diet: Regular, Heart Healthy Discharge Medications: Allergies as of 02/28/2018   No Known Allergies     Medication List    TAKE these medications   ferrous sulfate 325 (65 FE) MG tablet Take 1 tablet (325 mg total) by mouth 2 (two) times daily with a meal.   hydrochlorothiazide 12.5 MG capsule Commonly  known as:  MICROZIDE Take 1 capsule (12.5 mg total) by mouth daily for 3 days. Start taking on:  03/01/2018   ibuprofen 600 MG tablet Commonly known as:  ADVIL,MOTRIN Take 1 tablet (600 mg total) by mouth every 6 (six) hours.   NIFEdipine 30 MG 24 hr tablet Commonly known as:  PROCARDIA-XL/ADALAT CC Take 1 tablet (30 mg total) by mouth daily. Start taking on:  03/01/2018   PRENATAL VITAMIN PO Take by mouth.            Discharge Care Instructions  (From admission, onward)        Start     Ordered   02/28/18 0000  Discharge wound care:    Comments:  Sitz baths 2-3 times per days as needed; okay to continue using Dermoplast spray and ice packs as needed   02/28/18 1019     Outpatient follow up:  Follow-up Information    Shea Evans, MD. Schedule an appointment as soon as possible for a visit in 2 week(s).   Specialty:  Obstetrics and Gynecology Why:  Smart Start nurse visit in 1 week for BP check; f/u appointment with Dr. Juliene Pina in 2 weeks Contact information: [address] [place] [address] [phone]           Signed:  Carlean Jews, MSN, CNM [facility]

## 2018-02-28 NOTE — Progress Notes (Signed)
Baby love nurse called and message left to set up a visit in one week for a blood pressure check

## 2018-02-28 NOTE — Progress Notes (Signed)
Discharge teaching complete with pt. Pt understood all information and did not have any questions. 

## 2018-03-01 NOTE — Anesthesia Postprocedure Evaluation (Signed)
Anesthesia Post Note  Patient: Sarah Hodge  Procedure(s) Performed: AN AD HOC LABOR EPIDURAL     Patient location during evaluation: Mother Baby Anesthesia Type: Epidural Level of consciousness: sedated Pain management: pain level controlled Vital Signs Assessment: post-procedure vital signs reviewed and stable Respiratory status: spontaneous breathing Cardiovascular status: stable Postop Assessment: no headache, no backache, epidural receding, patient able to bend at knees and no apparent nausea or vomiting Anesthetic complications: no    Last Vitals:  Vitals:   02/28/18 0500 02/28/18 0926  BP: (!) 125/91 (!) 143/101  Pulse: (!) 106 88  Resp: 20 18  Temp: 36.7 C 36.7 C  SpO2: 100% 100%    Last Pain:  Vitals:   02/28/18 1100  TempSrc:   PainSc: 0-No pain   Pain Goal: Patients Stated Pain Goal: 2 (02/27/18 0800)               Lily Kernen JR,JOHN Susann GivensFRANKLIN

## 2018-04-14 ENCOUNTER — Other Ambulatory Visit: Payer: Self-pay | Admitting: Obstetrics & Gynecology

## 2018-04-14 DIAGNOSIS — R9389 Abnormal findings on diagnostic imaging of other specified body structures: Secondary | ICD-10-CM

## 2018-04-19 ENCOUNTER — Ambulatory Visit
Admission: RE | Admit: 2018-04-19 | Discharge: 2018-04-19 | Disposition: A | Discharge status: Home / Self Care | Payer: BLUE CROSS/BLUE SHIELD | Source: Ambulatory Visit | Attending: Obstetrics & Gynecology | Admitting: Obstetrics & Gynecology

## 2018-04-19 DIAGNOSIS — R9389 Abnormal findings on diagnostic imaging of other specified body structures: Secondary | ICD-10-CM

## 2018-04-24 ENCOUNTER — Other Ambulatory Visit: Payer: Self-pay | Admitting: Obstetrics and Gynecology

## 2018-04-24 DIAGNOSIS — R9389 Abnormal findings on diagnostic imaging of other specified body structures: Secondary | ICD-10-CM

## 2018-04-26 ENCOUNTER — Other Ambulatory Visit: Payer: Self-pay | Admitting: Obstetrics & Gynecology

## 2018-04-26 DIAGNOSIS — R9389 Abnormal findings on diagnostic imaging of other specified body structures: Secondary | ICD-10-CM

## 2018-04-29 ENCOUNTER — Ambulatory Visit
Admission: RE | Admit: 2018-04-29 | Discharge: 2018-04-29 | Disposition: A | Discharge status: Home / Self Care | Payer: BLUE CROSS/BLUE SHIELD | Source: Ambulatory Visit | Attending: Obstetrics and Gynecology | Admitting: Obstetrics and Gynecology

## 2018-04-29 DIAGNOSIS — R9389 Abnormal findings on diagnostic imaging of other specified body structures: Secondary | ICD-10-CM

## 2018-04-29 MED ORDER — GADOBENATE DIMEGLUMINE 529 MG/ML IV SOLN
15.0000 mL | Freq: Once | INTRAVENOUS | Status: AC | PRN
  Administered 2018-04-29: 15 mL via INTRAVENOUS

## 2019-06-25 IMAGING — MR MR ABDOMEN WO/W CM
17 series · 48 of 48 positions shown · IV contrast (multihance)
Comparison: Noncontrast chest CT 04/19/2018.

CLINICAL DATA: Indeterminate small hyperdense left renal lesions on
noncontrast chest CT. No history of malignancy.

EXAM:
MRI ABDOMEN WITHOUT AND WITH CONTRAST
TECHNIQUE: Multiplanar multisequence MR imaging of the abdomen was performed
both before and after the administration of intravenous contrast.
CONTRAST:  15mL MULTIHANCE GADOBENATE DIMEGLUMINE 529 MG/ML IV SOLN

[Series 4: ep2d_diff_b50_500_800_p2_trig · axial · 6.0mm · 1.88mm/px · z∈[-87,+92]mm · 5 of 72 slices shown]
[im 1/72]
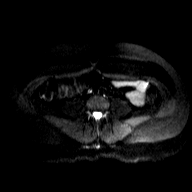
[im 18/72]
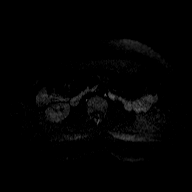
[im 36/72]
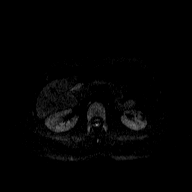
[im 54/72]
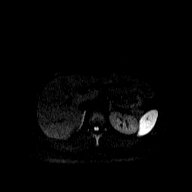
[im 72/72]
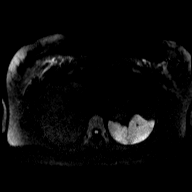

[Series 5: ep2d_diff_b50_500_800_p2_trig_adc · axial · 6.0mm · 1.88mm/px · z∈[-87,+92]mm · 2 of 24 slices shown]
[im 1/24]
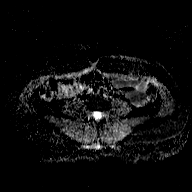
[im 24/24]
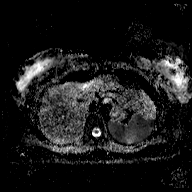

[Series 6: T2 · axial · 6.0mm · 1.41mm/px · z∈[-87,+92]mm · 2 of 24 slices shown (1 of 3)]
[im 1/24]
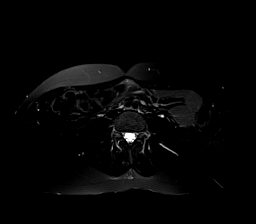
[im 24/24]
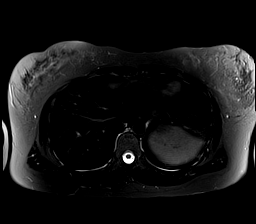

[Series 7: T2 · coronal · 5.0mm · 0.74mm/px · 2 of 16 slices shown (2 of 3)]
[im 1/16]
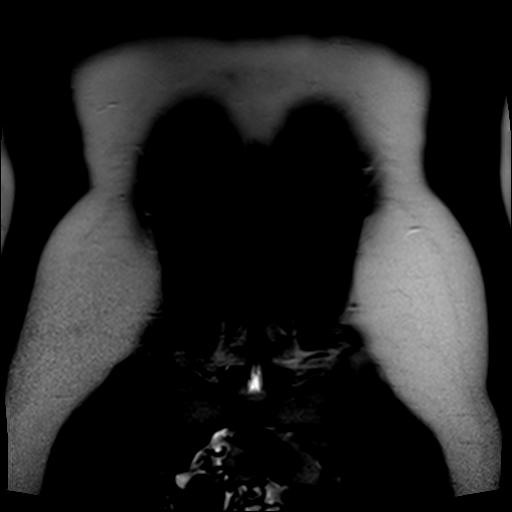
[im 16/16]
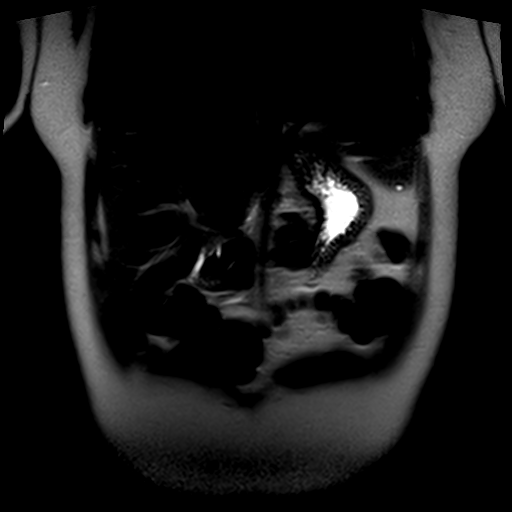

[Series 8: T2 · axial · 6.0mm · 0.70mm/px · 1 of 24 slices shown (3 of 3)]
[im 1/24]
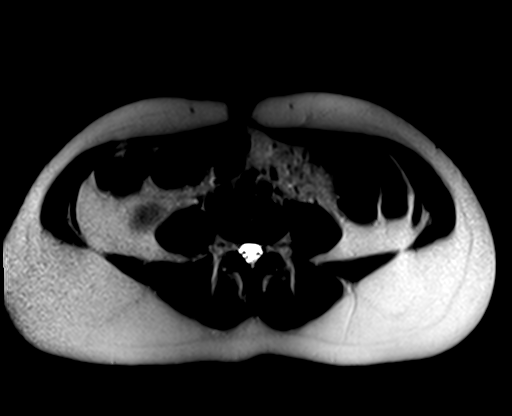

[Series 9: T1 · axial · 6.0mm · 0.70mm/px · z∈[-93,+86]mm · 3 of 48 slices shown]
[im 1/48]
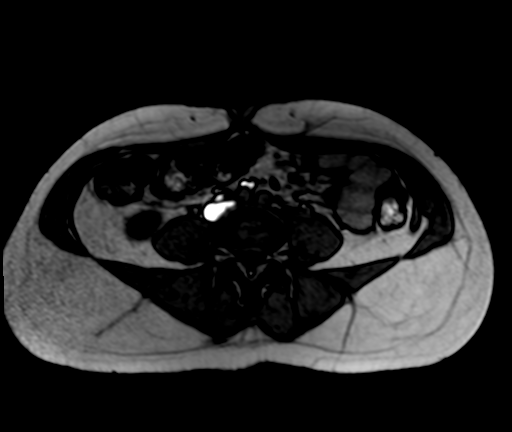
[im 24/48]
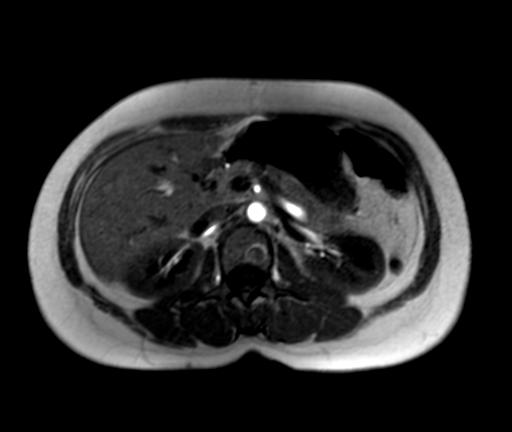
[im 48/48]
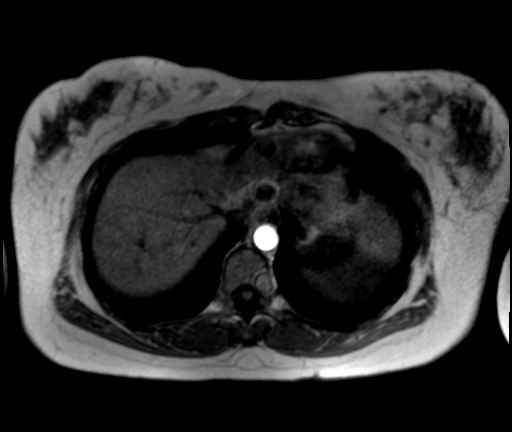

[Series 10: T1 dynamic · axial · non-contrast · 3.0mm · 1.41mm/px · z∈[-92,+85]mm · 3 of 60 slices shown (1 of 2)]
[im 1/60]
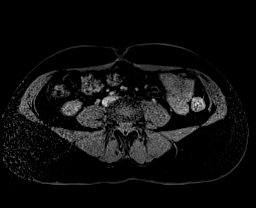
[im 30/60]
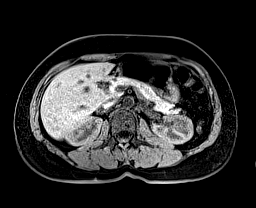
[im 60/60]
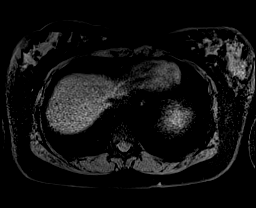

[Series 11: T1 dynamic · axial · non-contrast · 3.0mm · 1.41mm/px · z∈[-107,+82]mm · 3 of 64 slices shown (2 of 2)]
[im 1/64]
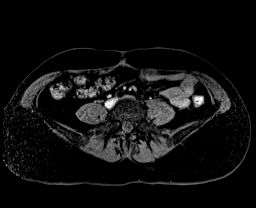
[im 32/64]
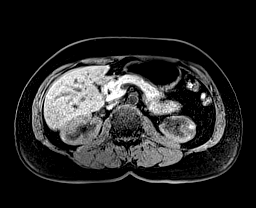
[im 64/64]
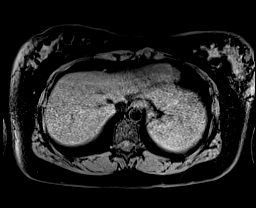

[Series 12: post 25 sec · axial · 3.0mm · 1.41mm/px · z∈[-107,+82]mm · 3 of 64 slices shown]
[im 1/64]
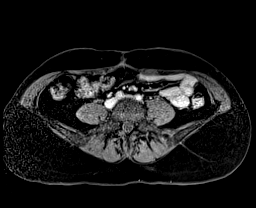
[im 32/64]
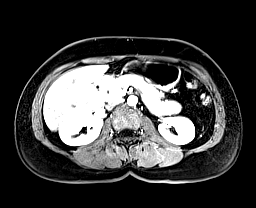
[im 64/64]
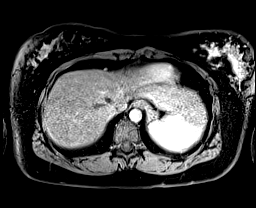

[Series 13: post 25 sec_sub · axial · 3.0mm · 1.41mm/px · z∈[-107,+82]mm · 3 of 64 slices shown]
[im 1/64]
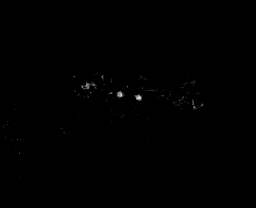
[im 32/64]
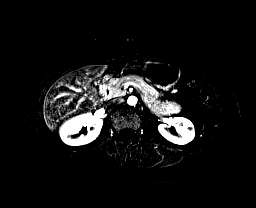
[im 64/64]
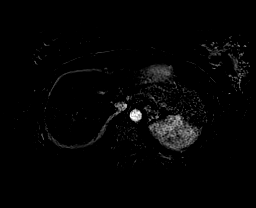

[Series 14: post 45 sec · axial · 3.0mm · 1.41mm/px · z∈[-107,+82]mm · 3 of 64 slices shown]
[im 1/64]
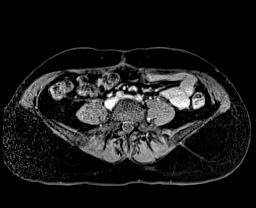
[im 32/64]
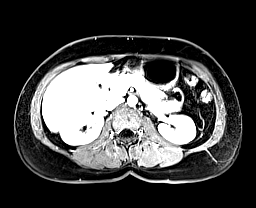
[im 64/64]
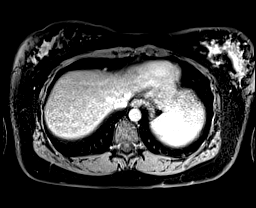

[Series 15: post 45 sec_sub · axial · 3.0mm · 1.41mm/px · z∈[-107,+82]mm · 3 of 64 slices shown]
[im 1/64]
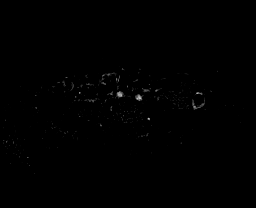
[im 32/64]
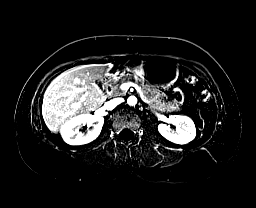
[im 64/64]
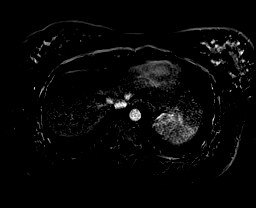

[Series 16: post 90 sec · axial · 3.0mm · 1.41mm/px · z∈[-107,+82]mm · 3 of 64 slices shown]
[im 1/64]
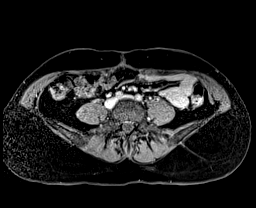
[im 32/64]
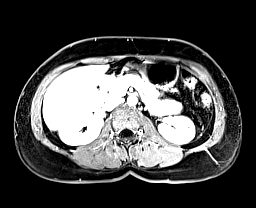
[im 64/64]
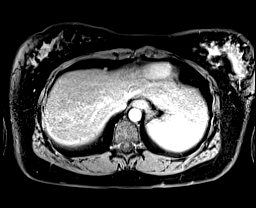

[Series 17: post 90 sec_sub · axial · 3.0mm · 1.41mm/px · z∈[-107,+82]mm · 3 of 64 slices shown]
[im 1/64]
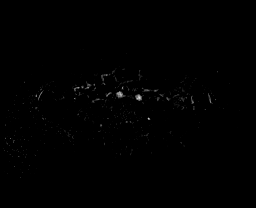
[im 32/64]
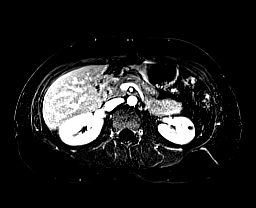
[im 64/64]
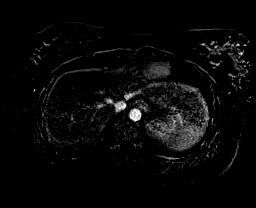

[Series 18: T1 dynamic post-contrast · coronal · 2.6mm · 0.78mm/px · 3 of 52 slices shown]
[im 1/52]
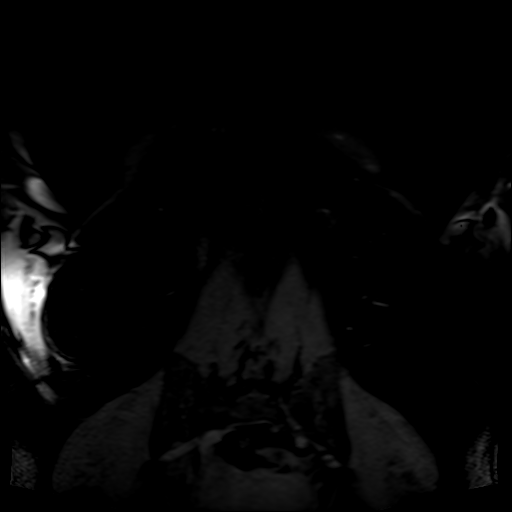
[im 26/52]
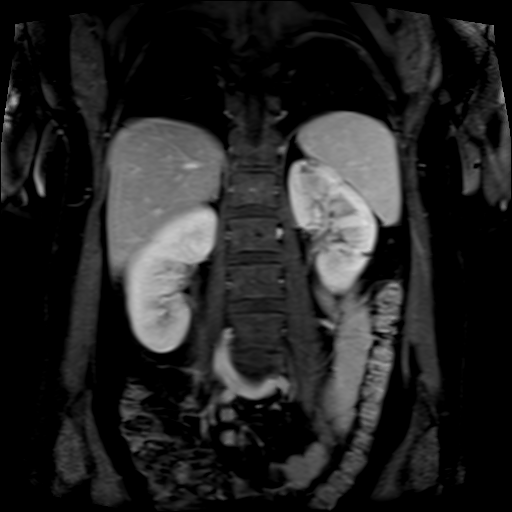
[im 52/52]
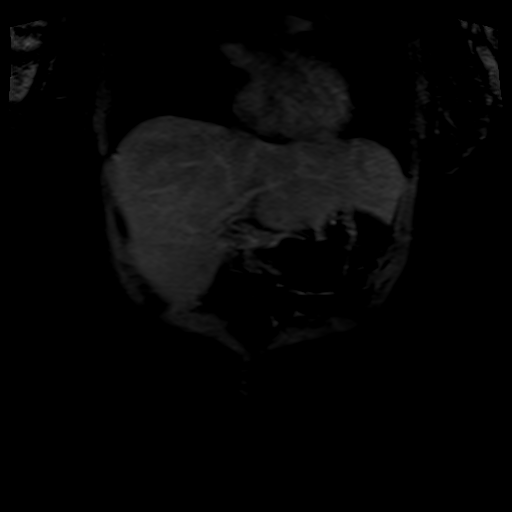

[Series 19: post axial 3+ · axial · 3.0mm · 1.41mm/px · z∈[-107,+82]mm · 3 of 64 slices shown (1 of 2)]
[im 1/64]
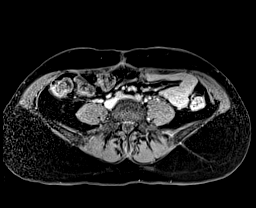
[im 32/64]
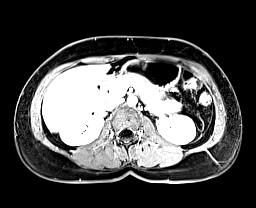
[im 64/64]
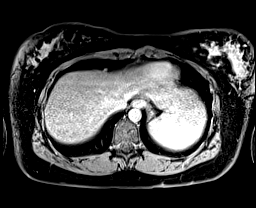

[Series 20: post axial 3+ · axial · 3.0mm · 1.41mm/px · z∈[-107,+82]mm · 3 of 64 slices shown (2 of 2)]
[im 1/64]
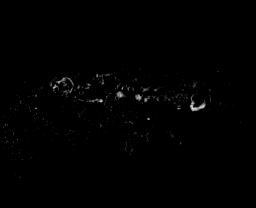
[im 32/64]
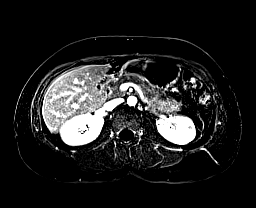
[im 64/64]
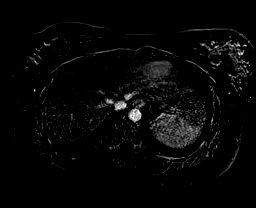

[48 of 48 positions shown; findings below may reference images not displayed]

FINDINGS: Lower chest:  The visualized lower chest appears unremarkable.

Hepatobiliary: There are scattered tiny hepatic cysts or biliary
hamartomas. There is an area of arterial phase enhancement within
the caudate lobe, measuring 3.4 x 1.4 cm on image [DATE]. There is no
corresponding abnormality on the delayed post-contrast or other
sequences. This likely represents focal nodular hyperplasia or
another incidental vascular lesion. No concerning hepatic findings.
No evidence of gallstones, gallbladder wall thickening or biliary
dilatation.

Pancreas: Unremarkable. No pancreatic ductal dilatation or
surrounding inflammatory changes.

Spleen: Normal in size without focal abnormality.

Adrenals/Urinary Tract: Both adrenal glands appear normal. Within
the left kidney, there are numerous well-circumscribed small lesions
with intrinsic T1 shortening and low T2 signal. There is no fat
signal in these. These measure up to 9 mm in the upper pole (image
[DATE]) and 9 mm in the lower pole (image [DATE]). Following contrast,
there is no enhancement of these lesions, confirmed on subtracted
images. There is no large lesion in the left upper pole; previously
reported finding is due to adjacent small cysts. There is a tiny
simple cyst in the lower pole of the right kidney. No evidence of
enhancing renal mass.

Stomach/Bowel: No evidence of bowel wall thickening, distention or
surrounding inflammatory change.

Vascular/Lymphatic: There are no enlarged abdominal lymph nodes. No
significant vascular findings.

Other: No ascites.

Musculoskeletal: No acute or significant osseous findings.
IMPRESSION: 1. Multiple small hemorrhagic/proteinaceous left renal cysts. No
evidence of enhancing renal mass.
2. Small hepatic cysts or hamartomas. Possible focal nodular
hyperplasia or other incidental vascular lesion in the caudate lobe
of the liver.

## 2020-02-02 ENCOUNTER — Other Ambulatory Visit: Payer: Self-pay

## 2020-02-02 ENCOUNTER — Ambulatory Visit: Payer: BC Managed Care – PPO | Attending: Internal Medicine

## 2020-02-02 DIAGNOSIS — Z23 Encounter for immunization: Secondary | ICD-10-CM

## 2020-02-02 NOTE — Progress Notes (Signed)
   Covid-19 Vaccination Clinic  Name:  Sarah Hodge    MRN: [id number] DOB: 01/05/1986  02/02/2020  [name] was observed post Covid-19 immunization for 15 minutes without incident. She was provided with Vaccine Information Sheet and instruction to access the V-Safe system.   [name] was instructed to call 911 with any severe reactions post vaccine: Marland Kitchen Difficulty breathing  . Swelling of face and throat  . A fast heartbeat  . A bad rash all over body  . Dizziness and weakness   Immunizations Administered    Name Date Dose VIS Date Route   Moderna COVID-19 Vaccine 02/02/2020 11:01 AM 0.5 mL 10/23/2019 Intramuscular   Manufacturer: Moderna   Lot: [id number]   NDC: 19471-252-71

## 2020-03-05 ENCOUNTER — Ambulatory Visit: Payer: BC Managed Care – PPO | Attending: Internal Medicine

## 2020-03-05 DIAGNOSIS — Z23 Encounter for immunization: Secondary | ICD-10-CM

## 2020-03-05 NOTE — Progress Notes (Signed)
   Covid-19 Vaccination Clinic  Name:  Sarah Hodge    MRN: [id number] DOB: 06/19/1986  03/05/2020  Sarah Hodge was observed post Covid-19 immunization for 15 minutes without incident. She was provided with Vaccine Information Sheet and instruction to access the V-Safe system.   Sarah Hodge was instructed to call 911 with any severe reactions post vaccine: Marland Kitchen Difficulty breathing  . Swelling of face and throat  . A fast heartbeat  . A bad rash all over body  . Dizziness and weakness   Immunizations Administered    Name Date Dose VIS Date Route   Moderna COVID-19 Vaccine 03/05/2020 10:33 AM 0.5 mL 10/23/2019 Intramuscular   Manufacturer: Moderna   Lot: [id number]   NDC: 14239-532-02

## 2020-06-18 NOTE — Progress Notes (Signed)
Cardiology Office Note:   Date:  06/20/2020  NAME:  Sarah Hodge    MRN: [id number] DOB:  [birth year]   PCP:  Patient, No Pcp Per  Cardiologist:  No primary care provider on file.   Referring MD: Sarah Evans, MD   Chief Complaint  Patient presents with   Abnormal ECG   History of Present Illness:   Sarah Hodge is a 34 y.o. female with a hx of pre-eclampsia who is being seen today for the evaluation of abnormal EKG at the request of Sarah Evans, MD.  She reports she had preeclampsia around 2 years ago with the birth of her first son.  She is remained on blood pressure medicine since that time.  She takes amlodipine 10 mg daily.  Her blood pressure is 120/90.  She does bring a log of her blood pressures at home and they range from 108 systolically to 116.  She does not have any values above 130 and she does not have any values that are in a dangerous range.  She reports that she exercises routinely walking 1 to 2 miles per episode.  She does this several times per week.  She also does activity such as swimming without any chest pain or shortness of breath.  She does have a history of high blood pressure in the family, as both mom and dad have this.  She reports that she is going to pursue a subsequent pregnancy.  Her obstetrician wanted to have her evaluated.  Given her history of preeclampsia we did discuss that she is at risk for future preeclampsia.  She cannot take amlodipine while pregnant or breast-feeding.  I recommended to switch over to labetalol.  This is a much safer medication in pregnancy.  Her obstetrician clearly is aware of this.  I do wonder if she actually has whitecoat hypertension.  Her blood pressure is very normal and she reports that her blood pressure is always low elevated in the physician office.  I think this is need to be monitored closely clearly during pregnancy.  Her blood pressures well controlled and we did discuss that there is little value in searching for secondary  causes given her well-controlled blood pressure.  She has no history of murmurs or any cardiovascular disease that I can tell.  Her EKG does show nonspecific ST-T changes which could be related to breast artifact.  She is a former smoker but quit a number of years ago.  She does not drink alcohol in excess.  No illicit drug use reported.  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  She is married with 1 child and plans for a second child.  Past Medical History: Past Medical History:  Diagnosis Date   Antepartum mild preeclampsia, third trimester 02/24/2018   Medical history non-contributory     Past Surgical History: Past Surgical History:  Procedure Laterality Date   NO PAST SURGERIES     WISDOM TOOTH EXTRACTION      Current Medications: Current Meds  Medication Sig   amLODipine (NORVASC) 10 MG tablet    Prenatal Vit-Fe Fumarate-FA (PRENATAL VITAMIN PO) Take by mouth.     Allergies:    Patient has no known allergies.   Social History: Social History   Socioeconomic History   Marital status: Married    Spouse name: Not on file   Number of children: 1   Years of education: Not on file   Highest education level: Not on file  Occupational History  Occupation: Warden/rangerdata analyst  Tobacco Use   Smoking status: Former Smoker    Years: 3.00   Smokeless tobacco: Never Used  Substance and Sexual Activity   Alcohol use: No   Drug use: No   Sexual activity: Yes    Birth control/protection: None  Other Topics Concern   Not on file  Social History Narrative   Not on file   Social Determinants of Health   Financial Resource Strain:    Difficulty of Paying Living Expenses:   Food Insecurity:    Worried About Programme researcher, broadcasting/film/videounning Out of Food in the Last Year:    Baristaan Out of Food in the Last Year:   Transportation Needs:    Freight forwarderLack of Transportation (Medical):    Lack of Transportation (Non-Medical):   Physical Activity:    Days of Exercise per Week:    Minutes of Exercise  per Session:   Stress:    Feeling of Stress :   Social Connections:    Frequency of Communication with Friends and Family:    Frequency of Social Gatherings with Friends and Family:    Attends Religious Services:    Active Member of Clubs or Organizations:    Attends BankerClub or Organization Meetings:    Marital Status:      Family History: The patient's family history includes Hyperlipidemia in her father and mother.  ROS:   All other ROS reviewed and negative. Pertinent positives noted in the HPI.     EKGs/Labs/Other Studies Reviewed:   The following studies were personally reviewed by me today:  EKG:  EKG is ordered today.  The ekg ordered today demonstrates normal sinus rhythm, heart rate 77, nonspecific ST-T changes, no evidence for infarction, and was personally reviewed by me.   Recent Labs: No results found for requested labs within last 8760 hours.   Recent Lipid Panel No results found for: CHOL, TRIG, HDL, CHOLHDL, VLDL, LDLCALC, LDLDIRECT  Physical Exam:   VS:  BP (!) 128/90    Pulse 77    Ht 5\' 8"  (1.727 m)    Wt 150 lb 9.6 oz (68.3 kg)    SpO2 100%    BMI 22.90 kg/m    Wt Readings from Last 3 Encounters:  06/20/20 150 lb 9.6 oz (68.3 kg)  02/24/18 189 lb (85.7 kg)  09/28/17 152 lb 6.4 oz (69.1 kg)    General: Well nourished, well developed, in no acute distress Heart: Atraumatic, normal size  Eyes: PEERLA, EOMI  Neck: Supple, no JVD Endocrine: No thryomegaly Cardiac: Normal S1, S2; RRR; no murmurs, rubs, or gallops Lungs: Clear to auscultation bilaterally, no wheezing, rhonchi or rales  Abd: Soft, nontender, no hepatomegaly  Ext: No edema, pulses 2+ Musculoskeletal: No deformities, BUE and BLE strength normal and equal Skin: Warm and dry, no rashes   Neuro: Alert and oriented to person, place, time, and situation, CNII-XII grossly intact, no focal deficits  Psych: Normal mood and affect   ASSESSMENT:   Sarah Hodge is a 34 y.o. female who presents for  the following: 1. Nonspecific abnormal electrocardiogram (ECG) (EKG)   2. Essential hypertension   3. Pre-eclampsia in third trimester     PLAN:   1. Nonspecific abnormal electrocardiogram (ECG) (EKG) 2. Essential hypertension 3. Pre-eclampsia in third trimester -History of preeclampsia 2 years ago.  Also has history of hypertension.  Her blood pressure at home ranges systolically around 100-115.  She really has no high blood pressures at home.  She is only on one  medication.  We did discuss given her family history she likely has essential hypertension.  She may just have whitecoat hypertension.  She can work with her primary care physician and obstetrician whether she needs blood pressure medication.  She may be able to try to come off of it to see how she does.  Clearly when she is pregnant she will need to switch over to labetalol or methyldopa.  She cannot take amlodipine while pregnant or breast-feeding.  Her history of preeclampsia does place her at risk for hypertension and future preeclampsia in subsequent pregnancies.  We are seeing that this is associated with future heart disease issues mainly hypertensive heart disease.  I would recommend she obtain an echocardiogram just to make sure her heart is structurally normal.  Clearly we will need a good baseline ultrasound study prior to her becoming pregnant again with her history of preeclampsia.  Her EKG today shows normal sinus rhythm with nonspecific ST-T changes which can be related to breast artifact. Overall I am not concerned for any underlying secondary cause of hypertension.  I do have a strong suspicion for whitecoat hypertension.  This will just need to be monitored closely.  I will follow the results of her ultrasound with her by phone.  We will communicate today's assessment to her obstetrician.  Disposition: Return if symptoms worsen or fail to improve.  Medication Adjustments/Labs and Tests Ordered: Current medicines are  reviewed at length with the patient today.  Concerns regarding medicines are outlined above.  Orders Placed This Encounter  Procedures   EKG 12-Lead   ECHOCARDIOGRAM COMPLETE   No orders of the defined types were placed in this encounter.   Patient Instructions  Medication Instructions:  The current medical regimen is effective;  continue present plan and medications.  *If you need a refill on your cardiac medications before your next appointment, please call your pharmacy*   Testing/Procedures: Echocardiogram - Your physician has requested that you have an echocardiogram. Echocardiography is a painless test that uses sound waves to create images of your heart. It provides your doctor with information about the size and shape of your heart and how well your hearts chambers and valves are working. This procedure takes approximately one hour. There are no restrictions for this procedure. This will be performed at our [facility] location - [address].    Follow-Up: At [facility], you and your health needs are our priority.  As part of our continuing mission to provide you with exceptional heart care, we have created designated Provider Care Teams.  These Care Teams include your primary Cardiologist (physician) and Advanced Practice Providers (APPs -  Physician Assistants and Nurse Practitioners) who all work together to provide you with the care you need, when you need it.  We recommend signing up for the patient portal called "MyChart".  Sign up information is provided on this After Visit Summary.  MyChart is used to connect with patients for Virtual Visits (Telemedicine).  Patients are able to view lab/test results, encounter notes, upcoming appointments, etc.  Non-urgent messages can be sent to your provider as well.   To learn more about what you can do with MyChart, go to [email or web address].    Your next appointment:   As needed  The format for your next  appointment:   In Person  Provider:   Lennie Odor, MD       Signed, Lenna Gilford. Flora Lipps, MD [facility]   [facility]  [address], [address] [place], [place] [address] [phone]  06/20/2020 11:03 AM

## 2020-06-20 ENCOUNTER — Encounter: Payer: Self-pay | Admitting: Cardiovascular Disease

## 2020-06-20 ENCOUNTER — Ambulatory Visit (INDEPENDENT_AMBULATORY_CARE_PROVIDER_SITE_OTHER): Payer: BC Managed Care – PPO | Admitting: Cardiovascular Disease

## 2020-06-20 ENCOUNTER — Other Ambulatory Visit: Payer: Self-pay

## 2020-06-20 VITALS — BP 128/90 | HR 77 | Ht 68.0 in | Wt 150.6 lb

## 2020-06-20 DIAGNOSIS — I10 Essential (primary) hypertension: Secondary | ICD-10-CM

## 2020-06-20 DIAGNOSIS — R9431 Abnormal electrocardiogram [ECG] [EKG]: Secondary | ICD-10-CM

## 2020-06-20 DIAGNOSIS — O1493 Unspecified pre-eclampsia, third trimester: Secondary | ICD-10-CM | POA: Diagnosis not present

## 2020-06-20 NOTE — Patient Instructions (Signed)
Medication Instructions:  The current medical regimen is effective;  continue present plan and medications.  *If you need a refill on your cardiac medications before your next appointment, please call your pharmacy*   Testing/Procedures: Echocardiogram - Your physician has requested that you have an echocardiogram. Echocardiography is a painless test that uses sound waves to create images of your heart. It provides your doctor with information about the size and shape of your heart and how well your heart's chambers and valves are working. This procedure takes approximately one hour. There are no restrictions for this procedure. This will be performed at our [address] location - [address], [address].    Follow-Up: At [facility], you and your health needs are our priority.  As part of our continuing mission to provide you with exceptional heart care, we have created designated Provider Care Teams.  These Care Teams include your primary Cardiologist (physician) and Advanced Practice Providers (APPs -  Physician Assistants and Nurse Practitioners) who all work together to provide you with the care you need, when you need it.  We recommend signing up for the patient portal called "MyChart".  Sign up information is provided on this After Visit Summary.  MyChart is used to connect with patients for Virtual Visits (Telemedicine).  Patients are able to view lab/test results, encounter notes, upcoming appointments, etc.  Non-urgent messages can be sent to your provider as well.   To learn more about what you can do with MyChart, go to [email or web address].    Your next appointment:   As needed  The format for your next appointment:   In Person  Provider:   [place] [employer] O'Neal, MD      

## 2020-06-23 ENCOUNTER — Ambulatory Visit (HOSPITAL_COMMUNITY): Payer: BC Managed Care – PPO | Attending: Cardiology

## 2020-06-23 ENCOUNTER — Other Ambulatory Visit: Payer: Self-pay

## 2020-06-23 DIAGNOSIS — I10 Essential (primary) hypertension: Secondary | ICD-10-CM | POA: Diagnosis present

## 2020-06-23 LAB — ECHOCARDIOGRAM COMPLETE
Area-P 1/2: 2.95 cm2
S' Lateral: 3 cm

## 2020-07-02 ENCOUNTER — Ambulatory Visit: Payer: BC Managed Care – PPO | Admitting: Cardiovascular Disease

## 2020-07-16 ENCOUNTER — Ambulatory Visit: Payer: BC Managed Care – PPO | Admitting: Cardiovascular Disease

## 2020-10-10 ENCOUNTER — Other Ambulatory Visit: Payer: BC Managed Care – PPO

## 2020-10-10 DIAGNOSIS — Z20822 Contact with and (suspected) exposure to covid-19: Secondary | ICD-10-CM

## 2020-10-12 LAB — NOVEL CORONAVIRUS, NAA: SARS-CoV-2, NAA: NOT DETECTED

## 2020-10-12 LAB — SARS-COV-2, NAA 2 DAY TAT

## 2020-11-22 NOTE — L&D Delivery Note (Addendum)
   Delivery Note:   G2P1001 at [redacted]w[redacted]d  Admitting diagnosis: Hypertension affecting pregnancy in third trimester [O16.3] Risks:  G1 hx SVD with severe preeclampsia at term needing multiple agents to control and has chronic HTN since that delivery.  Pt has CHTN, on Procardia 30mg  XL since pre-pregnancy. Has seen cardiologist. Labetalol was added in pregnancy, BP well controlled in 130s/ 80s range until today AMA- insufficient fetal fractions on NIPT, saw MFM, repeat NIPT nl at MFM but had amniocentesis by choice and 1st attempt failed, has amnion-chorion separation, so was anxious about preterm labor until it fused at 30 wks. She had 2nd amniocentesis at 18 wks with nl karyotype.  CHTN, AMA- Saw MFM for anatomy and follow up. Serial growth and Ante testing w/ NST and BPP were in office and 10/10 - last growth 36.6 wks 6'5" at 37% AC 63% Vx, AFI nl  A1GDM  Baby was breech since 28 wks but spontaneously verted to cephalic at 36.4 wks  Marginal CI  - growth AGA  First Stage:  Induction of labor:start 11/9 at 2220, vaginal cytotec x 2 doses overnight, Pitocin started next morning at 0630 Onset of labor: 0630 11/10 Augmentation: AROM, Pitocin, and Cytotec ROM: AROM 1119 11/10, clear fluid Active labor onset: 1100 Analgesia /Anesthesia/Pain control intrapartum: fentanyl IV, epidural  Second Stage:  Complete dilation at 10/01/2021  1547 Onset of pushing at 1555 FHR second stage category 1   Pushing in L lateral position with CNM and L&D staff support, spouse present for birth and supportive. Nuchal Cord: No  Delivery of a Live born female  Birth Weight:   APGAR: 9, 9  Newborn Delivery   Birth date/time: 10/01/2021 16:02:00 Delivery type: Vaginal, Spontaneous      in cephalic presentation, position OA to LOT. Easy shoulders, compound L arm, vigorous cry befor hips delivered, brought to mother's arms.   Cord double clamped after cessation of pulsation, cut by FOB.  Collection of cord  blood for typing completed. Cord blood donation- NA Arterial cord blood sample-  NA  Third Stage:  Placenta delivered-  with 3 vessels . Uterine tone intermittent atony resolved with massage and LUS sweep for clots x 2, bleeding small thereafter Uterotonics: Pitocin bolus IV and Cytotec buccal 400 mcg Placenta to L&D for disposal. Eccentric cord insertion noted 1 inch from placental edge.  2nd degree;Perineal  laceration identified.  Episiotomy:None  Local analgesia: epidural  Repair:3.0 vicryl in standard fashion, good hemostasis and approximation  Est. Blood Loss (mL):400.00   Complications: None   Mom to postpartum.  Baby Meera to Couplet care / Skin to Skin.  Patient will continue home meds for hypertension - labetalol 100 mg BID and Procardia 30 XL daily Repeat PEC labs in AM if symptomatic  Delivery Report:  Review the Delivery Report for details.     Signed: 13/08/2021, CNM, MSN 10/01/2021, 4:29 PM

## 2021-03-02 ENCOUNTER — Encounter: Payer: Self-pay | Admitting: Obstetrics & Gynecology

## 2021-03-23 LAB — OB RESULTS CONSOLE RPR: RPR: NONREACTIVE

## 2021-03-23 LAB — OB RESULTS CONSOLE GC/CHLAMYDIA
Chlamydia: NEGATIVE
Gonorrhea: NEGATIVE

## 2021-03-23 LAB — OB RESULTS CONSOLE HEPATITIS B SURFACE ANTIGEN: Hepatitis B Surface Ag: NEGATIVE

## 2021-03-23 LAB — OB RESULTS CONSOLE RUBELLA ANTIBODY, IGM: Rubella: IMMUNE

## 2021-03-23 LAB — OB RESULTS CONSOLE HIV ANTIBODY (ROUTINE TESTING): HIV: NONREACTIVE

## 2021-03-25 ENCOUNTER — Other Ambulatory Visit: Payer: Self-pay | Admitting: Obstetrics & Gynecology

## 2021-03-30 ENCOUNTER — Other Ambulatory Visit: Payer: Self-pay | Admitting: Obstetrics and Gynecology

## 2021-04-03 ENCOUNTER — Other Ambulatory Visit: Payer: Self-pay | Admitting: Obstetrics and Gynecology

## 2021-04-03 DIAGNOSIS — Z3A13 13 weeks gestation of pregnancy: Secondary | ICD-10-CM

## 2021-04-03 DIAGNOSIS — O280 Abnormal hematological finding on antenatal screening of mother: Secondary | ICD-10-CM

## 2021-04-03 DIAGNOSIS — Z363 Encounter for antenatal screening for malformations: Secondary | ICD-10-CM

## 2021-04-06 ENCOUNTER — Encounter: Payer: Self-pay | Admitting: *Deleted

## 2021-04-09 ENCOUNTER — Encounter: Payer: Self-pay | Admitting: *Deleted

## 2021-04-09 ENCOUNTER — Other Ambulatory Visit: Payer: Self-pay

## 2021-04-09 ENCOUNTER — Ambulatory Visit (HOSPITAL_BASED_OUTPATIENT_CLINIC_OR_DEPARTMENT_OTHER): Payer: BC Managed Care – PPO | Admitting: Genetic Counselor

## 2021-04-09 ENCOUNTER — Ambulatory Visit: Payer: BC Managed Care – PPO

## 2021-04-09 ENCOUNTER — Ambulatory Visit: Payer: BC Managed Care – PPO | Admitting: *Deleted

## 2021-04-09 ENCOUNTER — Ambulatory Visit: Payer: BC Managed Care – PPO | Attending: Obstetrics and Gynecology

## 2021-04-09 VITALS — BP 135/78 | HR 84

## 2021-04-09 DIAGNOSIS — Z3A13 13 weeks gestation of pregnancy: Secondary | ICD-10-CM | POA: Insufficient documentation

## 2021-04-09 DIAGNOSIS — O09521 Supervision of elderly multigravida, first trimester: Secondary | ICD-10-CM

## 2021-04-09 DIAGNOSIS — O280 Abnormal hematological finding on antenatal screening of mother: Secondary | ICD-10-CM | POA: Insufficient documentation

## 2021-04-09 DIAGNOSIS — I10 Essential (primary) hypertension: Secondary | ICD-10-CM

## 2021-04-09 DIAGNOSIS — Z363 Encounter for antenatal screening for malformations: Secondary | ICD-10-CM | POA: Insufficient documentation

## 2021-04-09 DIAGNOSIS — Z79899 Other long term (current) drug therapy: Secondary | ICD-10-CM | POA: Insufficient documentation

## 2021-04-09 DIAGNOSIS — Z7982 Long term (current) use of aspirin: Secondary | ICD-10-CM | POA: Insufficient documentation

## 2021-04-09 DIAGNOSIS — Z315 Encounter for genetic counseling: Secondary | ICD-10-CM | POA: Diagnosis not present

## 2021-04-09 DIAGNOSIS — O285 Abnormal chromosomal and genetic finding on antenatal screening of mother: Secondary | ICD-10-CM

## 2021-04-09 NOTE — Progress Notes (Signed)
04/09/2021  Ixchel Duck 1986/04/17 MRN: [id number] DOV: 04/09/2021  Ms. Green presented to the [facility] for a genetics consultation regarding her abnormal noninvasive prenatal screening (NIPS) result. Ms. Jech was accompanied to her appointment by her husband, Inda Merlin.   Indication for genetic counseling - NIPS high-risk for trisomy 53, trisomy 86, or triploidy due to low fetal fraction  Prenatal history  Ms. Emrich is a G90P1001, 35 y.o. female. Her current pregnancy has completed [redacted]w[redacted]d (Estimated Date of Delivery: 10/15/21). Ms. Lal and her husband have a three year old son together.   Ms. Fulgham denied exposure to environmental toxins or chemical agents. She denied the use of alcohol, tobacco or street drugs. She reported taking prenatal vitamins, baby aspirin, and Nifedipine. She denied significant viral illnesses, fevers, and bleeding during the course of her pregnancy. She developed hypertension as a result of preeclampsia in her prior pregnancy. Her medical and surgical histories were otherwise noncontributory.  Family History  A three generation pedigree was drafted and reviewed. The family history is remarkable for the following:  - Ms. Heist's brother has a son who developed a severe bacterial infection at birth due to meconium aspiration. He eventually developed hydrocephalus and began having seizures for which he is not treated. He is 2 and a [age] years old and now has developmental delays and suspected autism.  The remaining family histories were reviewed and found to be noncontributory for birth defects, intellectual disability, recurrent pregnancy loss, and known genetic conditions.    The patient's ancestry is [place]. The father of the pregnancy's ancestry is [place]. Ashkenazi Jewish ancestry and consanguinity were denied. Pedigree will be scanned under Media.  Discussion  NIPS result:  Ms. Camilo was referred for genetic counseling as she had [facility]orama  noninvasive prenatal screening (NIPS) through [place] that was high risk due to insufficient fetal DNA fraction. Ms. Das had a fetal fraction of 2.6% at [redacted]w[redacted]d. Given the insufficient fetal fraction, an additional analysis was performed by the laboratory that identified the pregnancy as being at high risk (1 in 17, or ~6%) for triploidy, trisomy 81, and trisomy 56. This means that there is a ~94% chance the fetus will not be affected by one of these conditions.   Panorama NIPS utilizes single nucleotide polymorphisms (SNPs) to distinguish maternal from fetal (placental) DNA. The term "fetal fraction" refers to the amount of sample that is believed to have come from fetal DNA rather than maternal DNA. We reviewed that there are many possible reasons a sample may have a low fetal fraction, including early gestational age, high maternal BMI, suboptimal sample collection, maternal use of medications like low molecular weight heparin, pregnancy complications, and normal variation. Pregnancies affected by triploidy, trisomy 79, and trisomy 45 have all been associated with low fetal fraction on NIPS as well; however, there is no increased incidence of trisomy 59 or monosomy X in cases with low fetal fraction. Thus, the risk for these conditions is not increased above Ms. Accomando's age-related risk. It is thought that pregnancies with triploidy, trisomy 85, and trisomy 18 may have smaller placentas, which could result in an insufficient fetal fraction. We briefly reviewed that triploidy, trisomy 9, and trisomy 62 are lethal conditions that impact various organ systems throughout the body. Fetuses with triploidy, trisomy 24, and trisomy 61 often pass away during pregnancy; affected infants often pass away very shortly after birth, and most do not survive beyond 36 year of age.  Additional screening/testing options:  We reviewed additional screening/testing options that are available to follow-up on this abnormal NIPS result.  Firstly, Ms. Elby ShowersRaj had an early complete ultrasound today prior to our  visit. The ultrasound report will be sent under separate cover. There were no visualized fetal anomalies or markers suggestive of aneuploidy. Ms. Elby ShowersRaj was informed that while this ultrasound has limitations due to her early gestational age, some congenital anomalies or placental abnormalities may be possible to see on this ultrasound. She will also have an anatomy ultrasound around 18-20 weeks' gestation to assess for fetal anomalies and markers for chromosomal aneuploidies in more detail. Ms. Elby ShowersRaj was counseled that 90-95% of fetuses with trisomy 6513, trisomy 1718, or triploidy demonstrate a sign of the respective conditions on anatomy ultrasound. We reviewed common ultrasound findings associated with these conditions.   Secondly, we discussed that redrawing a new sample for NIPS is possible. Now that Ms. Elby ShowersRaj is a few weeks further along in her pregnancy, the fetal fraction will likely be higher. If the redraw was successful and contained a sufficient fetal fraction, results could clarify the risks for chromosomal aneuploidy in the current pregnancy. Ms. Elby ShowersRaj was informed that a NIPS redraw could be pursued through the same laboratory that did her original screening [facility]), or she could consider pursuing NIPS through a different laboratory. The laboratory [facility] is able to provide results for samples with a fetal fraction of 1% or higher; however, [facility] NIPS cannot assess for triploidy due to differences in the technology utilized by the two laboratories. Ms. Elby ShowersRaj could pursue a redraw for NIPS now or wait a few more weeks to allow the fetal fraction to rise more.   Finally, Ms. Elby ShowersRaj was counseled regarding diagnostic testing via chorionic villus sampling (CVS) at this point in pregnancy or amniocentesis available from 16 weeks' gestation. We discussed the technical aspects of the procedures and quoted up to a 1 in 500 (0.2%) risk for  spontaneous pregnancy loss or other adverse pregnancy outcomes as a result of either procedure. Fluorescent in situ hybridization (FISH) can provide a preliminary answer as to whether or not trisomy 13, 18, or triploidy is suspected in a fetus; however, it is not considered diagnostic, and could miss other abnormalities involving other chromosomes or chromosomal rearrangements. Cultured cells from a diagnostic testing sample allow for the visualization of a fetal karyotype, which can detect >99% of large chromosomal aberrations, including trisomy 13, trisomy 2018, or triploidy. Chromosomal microarray can also be performed to identify smaller deletions or duplications of fetal chromosomal material.    We reviewed the benefits and limitations of diagnostic testing. CVS is able to provide diagnostic information earlier, which allows for pregnancy management decisions to be made earlier as well. A limitation to CVS is the possibility of confined placental mosaicism. CVS assesses placental tissue which should be representative of the fetus given that they are made up of the same sperm and egg cells; however, in 1-2% of cases, confined placental mosaicism may be of concern. If mosaicism was suspected based on results from CVS, it would be recommended that follow-up with amniocentesis be performed to assess whether mosaicism is present in the fetus (as amniotic fluid contains cells directly from the fetus) or is confined to the placenta. We discussed that another limitation of diagnostic testing is that it cannot detect all possible genetic conditions. Standard testing ordered on CVS/amniocentesis samples include fetal chromosomal analyses. Testing for single gene disorders is often not ordered unless indicated by a family history or particular  ultrasound anomalies.   Pregnancy management options:  We reviewed that if Ms. Carrasco opted to undergo diagnostic testing and results were to come back positive, it could impact  pregnancy management in several different ways. We discussed that some individuals may choose to end a pregnancy or consider adoption if a diagnosis of a chromosomal abnormality were confirmed in the fetus. For individuals who would not alter their pregnancy management regardless of testing outcomes, a prenatal diagnosis could allow for delivery planing and prenatal consults with specialists that would be involved in the infant's care, and time to plan and prepare emotionally, physically, and financially. Additionally, diagnostic testing can help to inform recurrence risks for future pregnancies.   The couple informed me that they would likely terminate the pregnancy should a diagnosis of trisomy 32, trisomy 5, or triploidy be confirmed in the fetus. They were informed that it is an option to end a pregnancy until [redacted]w[redacted]d at most clinics in the state of [place]. Termination is still an option beyond this timepoint in other states. Termination of pregnancy may occur through induction of labor or a dilation & evacuation (D&E) procedure. We briefly reviewed the technical aspects of the D&E and induction procedures as well as associated benefits, limitations, and risks associated with each method. We also reviewed [place]'s 72 hour consent law for termination procedures.  Given that the couple would manage the pregnancy differently if a fetal chromosomal abnormality were confirmed, they opted to pursue further testing. Ms. Perret elected to schedule an amniocentesis procedure rather than pursue a CVS. In the meantime, she also wanted to have a sample redrawn for NIPS in hopes that it will return low-risk and provide her with some peace of mind while awaiting her amniocentesis. She elected to have NIPS through the laboratory [facility] since they are validated to provide results with a lower fetal fraction, despite the limitation of the screen not being able to detect triploidy. Ms. Nazareno confirmed that she  would still proceed with an amniocentesis to confirm results from NIPS even if they were to come back low-risk, as she understands that diagnostic testing would be the only way to definitively rule out trisomy 78, trisomy 55, or triploidy in the fetus  Carrier screening results:  Finally, we reviewed that Ms. Star had Horizon carrier screening performed through [place], which was negative for 4 conditions. Thus, her risk to be a carrier for these 4 conditions (listed separately in the laboratory report) has been reduced but not eliminated. She also had a negative hemoglobin electrophoresis, significantly reducing her chance of being a carrier for a hemoglobinopathy such as sickle cell disease. These negative results put her pregnancies at significantly decreased risk of being affected by one of these conditions.  Plan:  Ms. Dvorsky had a sample drawn for [facility] NIPS today. The couple elected to pursue testing through [facility]'s self-pay option of $99. Results will take approximately one week to be returned. I will call the couple once these results become available.  Ms. Carton will also return to [facility] for an amniocentesis on 04/30/21 and an anatomy ultrasound on 05/14/21. The couple expressed interest in possibly pursuing chromosomal microarray analysis on their amniocentesis sample in addition to [facility] and karyotype; however, they requested that I check with their insurance to see if this is a covered service or if prior authorization is needed for this test. I will contact Ms. Allcock's insurance company to determine this and call the couple once I have more information.  I  counseled Ms. Laplante regarding the above risks and available options. The approximate face-to-face time with the genetic counselor was 65 minutes.  In summary:  Discussed NIPS result and options for follow-up testing  1 in 17 (~6%) chance for trisomy 4, trisomy 37, or triploidy due to low fetal fraction  Had sample drawn for [facility] NIPS  (lower fetal fraction threshold). We will follow results  Will return for amniocentesis on 6/9 to definitively diagnose or rule out fetal chromosomal abnormality  Reviewed results of ultrasound  No fetal anomalies or markers seen but limited due to early gestational age  Will return for detailed anatomy ultrasound on 6/23  Discussed negative hemoglobin electrophoresis and Horizon-4 carrier screening results  Reviewed family history concerns   Gershon Crane, MS, Aeronautical engineer

## 2021-04-10 ENCOUNTER — Other Ambulatory Visit: Payer: Self-pay | Admitting: *Deleted

## 2021-04-10 DIAGNOSIS — O280 Abnormal hematological finding on antenatal screening of mother: Secondary | ICD-10-CM

## 2021-04-13 ENCOUNTER — Ambulatory Visit: Payer: Self-pay

## 2021-04-15 ENCOUNTER — Other Ambulatory Visit: Payer: Self-pay | Admitting: Maternal & Fetal Medicine

## 2021-04-15 DIAGNOSIS — O280 Abnormal hematological finding on antenatal screening of mother: Secondary | ICD-10-CM

## 2021-04-15 DIAGNOSIS — O09522 Supervision of elderly multigravida, second trimester: Secondary | ICD-10-CM

## 2021-04-15 DIAGNOSIS — Z3689 Encounter for other specified antenatal screening: Secondary | ICD-10-CM

## 2021-04-16 ENCOUNTER — Telehealth: Payer: Self-pay | Admitting: Genetic Counselor

## 2021-04-16 NOTE — Telephone Encounter (Addendum)
ADDENDUM: The other test Dr. Juliene Pina requested was AF-AFP for open neural tube defects, which we typically order on all of our amniocentesis samples. We will be sure to add this order to Sarah Hodge's amniocentesis.  -----------------------------------------------------------------------------------------------------------------------------  I called Sarah Hodge to discuss her negative noninvasive prenatal screening (NIPS) result. Specifically, Sarah Hodge had a repeat sample drawn for NIPS through the laboratory [facility], as her initial [facility]orama NIPS result through [place] came back with a high risk for trisomy 72, trisomy 22, or triploidy due to low fetal fraction (see Genetic Counseling note from 04/09/21 for more details). We discussed that these negative results demonstrated an expected representation of chromosome 21, 18, 13, and sex chromosome material, greatly reducing the likelihood of trisomies 54, 88, or 69 and sex chromosome aneuploidies for the pregnancy. Sarah Hodge requested to know about the expected fetal sex, which is female.Fetal fraction was higher in this redraw sample, ~5% at 13 weeks.  NIPS analyzes placental DNA in maternal circulation. NIPS is considered to be highly specific and sensitive, but is not considered to be a diagnostic test. We reviewed that this testing identifies 95-99% of pregnancies with trisomies 17, 66, and 9, as well as sex chromosome aneuploidies, but may miss some cases of these conditions and does not test for all genetic conditions. Sarah Hodge understands that a limitation of [facility] NIPS is that it is unable to detect cases of triploidy.   Sarah Hodge informed me that she would still like to pursue an amniocentesis to confirm these results and fully rule out triploidy for the current fetus. She will return for this procedure on 6/9. She told me that Dr. Juliene Pina requested that we add on additional test to her amniocentesis sample; however, Sarah Hodge could not remember which test this was. She  will contact Dr. Juliene Pina and then notify me of the extra test request. In the meantime, I informed Sarah Hodge that we are working on obtaining prior authorization for [facility], karyotype, and chromosomal microarray on amniocentesis through her insurance company. Sarah Hodge confirmed that she had no further questions at this time.  Gershon Crane, MS, [facility] Genetic Counselor

## 2021-04-30 ENCOUNTER — Ambulatory Visit: Payer: BC Managed Care – PPO | Attending: Maternal & Fetal Medicine

## 2021-04-30 ENCOUNTER — Ambulatory Visit: Payer: BC Managed Care – PPO | Admitting: *Deleted

## 2021-04-30 ENCOUNTER — Other Ambulatory Visit: Payer: Self-pay

## 2021-04-30 ENCOUNTER — Encounter: Payer: Self-pay | Admitting: *Deleted

## 2021-04-30 ENCOUNTER — Ambulatory Visit: Payer: BC Managed Care – PPO

## 2021-04-30 VITALS — BP 132/75 | HR 86

## 2021-04-30 DIAGNOSIS — Z3689 Encounter for other specified antenatal screening: Secondary | ICD-10-CM

## 2021-04-30 DIAGNOSIS — Z3A16 16 weeks gestation of pregnancy: Secondary | ICD-10-CM

## 2021-04-30 DIAGNOSIS — Z36 Encounter for antenatal screening for chromosomal anomalies: Secondary | ICD-10-CM | POA: Insufficient documentation

## 2021-04-30 DIAGNOSIS — O285 Abnormal chromosomal and genetic finding on antenatal screening of mother: Secondary | ICD-10-CM

## 2021-04-30 DIAGNOSIS — O09522 Supervision of elderly multigravida, second trimester: Secondary | ICD-10-CM | POA: Diagnosis not present

## 2021-04-30 DIAGNOSIS — O280 Abnormal hematological finding on antenatal screening of mother: Secondary | ICD-10-CM | POA: Diagnosis not present

## 2021-04-30 DIAGNOSIS — Z8759 Personal history of other complications of pregnancy, childbirth and the puerperium: Secondary | ICD-10-CM

## 2021-04-30 DIAGNOSIS — O10012 Pre-existing essential hypertension complicating pregnancy, second trimester: Secondary | ICD-10-CM | POA: Diagnosis not present

## 2021-05-01 ENCOUNTER — Other Ambulatory Visit: Payer: Self-pay | Admitting: *Deleted

## 2021-05-01 DIAGNOSIS — Z36 Encounter for antenatal screening for chromosomal anomalies: Secondary | ICD-10-CM

## 2021-05-07 ENCOUNTER — Ambulatory Visit: Payer: BC Managed Care – PPO | Admitting: *Deleted

## 2021-05-07 ENCOUNTER — Ambulatory Visit: Payer: BC Managed Care – PPO | Attending: Obstetrics and Gynecology

## 2021-05-07 ENCOUNTER — Encounter: Payer: Self-pay | Admitting: *Deleted

## 2021-05-07 ENCOUNTER — Other Ambulatory Visit: Payer: Self-pay

## 2021-05-07 VITALS — BP 143/81 | HR 81

## 2021-05-07 DIAGNOSIS — Z3A17 17 weeks gestation of pregnancy: Secondary | ICD-10-CM | POA: Diagnosis not present

## 2021-05-07 DIAGNOSIS — O322XX0 Maternal care for transverse and oblique lie, not applicable or unspecified: Secondary | ICD-10-CM | POA: Diagnosis not present

## 2021-05-07 DIAGNOSIS — O10012 Pre-existing essential hypertension complicating pregnancy, second trimester: Secondary | ICD-10-CM | POA: Diagnosis not present

## 2021-05-07 DIAGNOSIS — O09522 Supervision of elderly multigravida, second trimester: Secondary | ICD-10-CM | POA: Diagnosis not present

## 2021-05-07 DIAGNOSIS — Z36 Encounter for antenatal screening for chromosomal anomalies: Secondary | ICD-10-CM

## 2021-05-07 DIAGNOSIS — O280 Abnormal hematological finding on antenatal screening of mother: Secondary | ICD-10-CM

## 2021-05-07 DIAGNOSIS — Z8759 Personal history of other complications of pregnancy, childbirth and the puerperium: Secondary | ICD-10-CM

## 2021-05-11 ENCOUNTER — Telehealth: Payer: Self-pay | Admitting: Genetic Counselor

## 2021-05-11 NOTE — Telephone Encounter (Signed)
Received a call from Ms. Baine to discuss her negative FISH results from amniocentesis. We discussed that no numerical abnormalities were identified for chromosomes X, 13, 18, or 21, reducing the likelihood of full trisomy 11, trisomy 31, trisomy 110, sex chromosome aneuploidies, and triploidy in the fetus. I reviewed that while FISH results are not considered diagnostic, this result is reassuring that the fetus likely does not have full trisomy 48, full trisomy 63, or triploidy.  Ms. Camey was counseled that per [facility], the amniotic fluid appeared to contain some blood. For this reason, maternal cell contamination could be of concern. A sample for maternal cell contamination studies was drawn and will be ran against karyotype and chromosomal microarray analysis to ensure that the fetus truly does not have trisomy 103, trisomy 38, triploidy, or another chromosomal abnormality and that the initial FISH result was not only representative of Ms. Ayotte herself. I reassured her that it is promising that she has had low-risk [facility] NIPS, a normal-appearing first trimester ultrasound, and this normal FISH result.  Fetal karyotype and chromosomal microarray on the amniocentesis sample is still being completed. While it is expected that karyotype will confirm the results from [facility], there is the possibility of something else being identified on karyotype, such as a chromosomal rearrangement, a large deletion or duplication of chromosomal material, or an abnormality involving a chromosome other than the ones evaluated via FISH. Chromosomal microarray analysis will be performed to assess for deletions or duplications of chromosomal material that fall below the size threshold for karyotype. Results from karyotype will likely be available by the end of next week (7/1). Myself or my colleague Katrina Stack will call Ms. Mcgillis when results become available. I encouraged Ms. Westra to contact us if she has not heard by the end of the week  so we can call the laboratory for an update. Ms. Rockhold confirmed that she had no further questions at this time.    Gershon Crane, MS, [facility] Genetic Counselor

## 2021-05-14 ENCOUNTER — Ambulatory Visit: Payer: BC Managed Care – PPO

## 2021-05-23 ENCOUNTER — Telehealth: Payer: Self-pay | Admitting: Obstetrics and Gynecology

## 2021-05-23 NOTE — Telephone Encounter (Signed)
I spoke with Ms. Smotherman with the results of the chromosome analysis from her recent amniocentesis.  The number and structure of the chromosomes were analyzed to detect problems such as Down syndrome with > 99.5% accuracy.  This analysis on the sample revealed a baby with 46 chromosomes, the typical number, and no evidence of structural rearrangements, deletions or duplications of large regions of the chromosomes.  As requested to know, the sex of the baby is female.    The maternal cell contamination and microarray are still pending on this sample.  Cherly Anderson, MS, CGC

## 2021-05-28 ENCOUNTER — Ambulatory Visit: Payer: BC Managed Care – PPO | Admitting: *Deleted

## 2021-05-28 ENCOUNTER — Other Ambulatory Visit: Payer: Self-pay | Admitting: Maternal & Fetal Medicine

## 2021-05-28 ENCOUNTER — Ambulatory Visit: Payer: BC Managed Care – PPO | Attending: Maternal & Fetal Medicine

## 2021-05-28 ENCOUNTER — Other Ambulatory Visit: Payer: Self-pay | Admitting: *Deleted

## 2021-05-28 ENCOUNTER — Other Ambulatory Visit: Payer: Self-pay

## 2021-05-28 VITALS — BP 119/75 | HR 80

## 2021-05-28 DIAGNOSIS — O09522 Supervision of elderly multigravida, second trimester: Secondary | ICD-10-CM | POA: Diagnosis not present

## 2021-05-28 DIAGNOSIS — O10912 Unspecified pre-existing hypertension complicating pregnancy, second trimester: Secondary | ICD-10-CM

## 2021-05-28 DIAGNOSIS — Z3689 Encounter for other specified antenatal screening: Secondary | ICD-10-CM | POA: Insufficient documentation

## 2021-05-28 DIAGNOSIS — O280 Abnormal hematological finding on antenatal screening of mother: Secondary | ICD-10-CM

## 2021-05-28 DIAGNOSIS — Z3A20 20 weeks gestation of pregnancy: Secondary | ICD-10-CM | POA: Insufficient documentation

## 2021-05-28 LAB — MCC TRACKING

## 2021-06-02 ENCOUNTER — Telehealth: Payer: Self-pay | Admitting: Obstetrics and Gynecology

## 2021-06-02 LAB — CHROMOSOME, AFP, AMNIOTIC FL
AFP, Amniotic Fluid (mcg/ml): 8.8 ug/mL
Cells Analyzed: 15
Cells Counted: 15
Cells Karyotyped: 2
Colonies: 15
GTG Band Resolution Achieved: 450
Gestational Age(Wks): 17
MOM, Amniotic Fluid: 0.81

## 2021-06-02 LAB — MATERNAL CELL CONTAMINATION

## 2021-06-02 LAB — INSIGHT AMNIO FISH XY,13,18,21
Cells Analyzed: 75
Cells Counted: 75

## 2021-06-02 LAB — SNP MICROARRAY-PRENATAL (REVEAL)

## 2021-06-02 NOTE — Telephone Encounter (Signed)
I spoke with Ms. Raya to review the results of the chromosomal microarray and maternal cell contamination [facility]) studies from her recent amniocentesis.  The [facility] results are negative, which means the studies performed were looking at fetal and not maternal cells.  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 Mutations and deletions of the AR gene have been described in individuals with androgen insensitivity syndrome (AIS) characterized by evidence of feminization (i.e., undermasculinization) of the external genitalia at birth, abnormal secondary sexual development in puberty, and infertility in individuals with a 46,XY karyotype. AIS represents a spectrum of defects in androgen action (see reference). This alteration has partially duplicated the gene which may or may not disrupt the normal gene function. In addition, 46,XX individuals do not display sex reversal phenotypes or infertility if carrying a pathogenic mutation within the AR gene but risk passing the mutation to a 46,XY offspring.  In order to further evaluate clinical relevance, parental analysis is advised to determine whether this copy number variant was inherited. If a maternal duplication carrier is identified, extended familial studies may be considered to better understand the possible implications of the alteration. Inheritance from a clinically  normal father would help establish a benign nature of the duplication.  No other DNA copy number changes or copy neutral ROH were detected within the present reporting criteria.  We discussed that this variant is within the gene which causes androgen insensitivity syndrome, a condition that results in various effects on female fetuses including feminization of external genitalia, infertility and abnormal secondary sex characteristics due to abnormal response to androgens.  We do not expect effects on this baby because she is a female (46,XX). It would be important to her to be aware of this as she considers future reproduction because she could pass this on to her children.  To better understand this VUS, [facility] offers no charge testing of both parents.  Ms. Froemming was going to think about this option and let me know if she would like the testing at her next visit here.  If they would like testing, we need to order test code [id number] (qPCR) and draw a green top sodium heparin tube of blood for the studies.  We are happy to speak with this family further to address any questions.  We can be reached at [phone].  Cherly Anderson, MS, CGC

## 2021-06-26 ENCOUNTER — Other Ambulatory Visit: Payer: Self-pay

## 2021-06-26 ENCOUNTER — Encounter: Payer: Self-pay | Admitting: *Deleted

## 2021-06-26 ENCOUNTER — Ambulatory Visit: Payer: BC Managed Care – PPO | Attending: Obstetrics and Gynecology

## 2021-06-26 ENCOUNTER — Ambulatory Visit: Payer: BC Managed Care – PPO | Admitting: *Deleted

## 2021-06-26 VITALS — BP 126/78 | HR 75

## 2021-06-26 DIAGNOSIS — O09522 Supervision of elderly multigravida, second trimester: Secondary | ICD-10-CM | POA: Diagnosis not present

## 2021-08-19 ENCOUNTER — Encounter: Payer: BC Managed Care – PPO | Attending: Obstetrics & Gynecology | Admitting: Registered"

## 2021-08-19 ENCOUNTER — Other Ambulatory Visit: Payer: Self-pay

## 2021-08-19 ENCOUNTER — Encounter: Payer: Self-pay | Admitting: Registered"

## 2021-08-19 DIAGNOSIS — O24419 Gestational diabetes mellitus in pregnancy, unspecified control: Secondary | ICD-10-CM | POA: Diagnosis present

## 2021-08-19 NOTE — Progress Notes (Signed)
Patient was seen on 08/19/21 for Gestational Diabetes self-management class at [facility]. The following learning objectives were met by the patient during this course:  States the definition of Gestational Diabetes States why dietary management is important in controlling blood glucose Describes the effects each nutrient has on blood glucose levels Demonstrates ability to create a balanced meal plan Demonstrates carbohydrate counting  States when to check blood glucose levels Demonstrates proper blood glucose monitoring techniques States the effect of stress and exercise on blood glucose levels States the importance of limiting caffeine and abstaining from alcohol and smoking  Blood glucose monitor given: Patient has meter and is checking blood sugar prior to class    Patient instructed to monitor glucose levels: FBS: 60 - <95; 1 hour: <140; 2 hour: <120  Patient received handouts: Nutrition Diabetes and Pregnancy, including carb counting list  Patient will be seen for follow-up as needed.

## 2021-09-16 LAB — OB RESULTS CONSOLE GBS: GBS: NEGATIVE

## 2021-09-23 ENCOUNTER — Other Ambulatory Visit (HOSPITAL_COMMUNITY): Payer: Self-pay

## 2021-09-25 ENCOUNTER — Inpatient Hospital Stay (HOSPITAL_COMMUNITY)
Admission: AD | Admit: 2021-09-25 | Payer: BC Managed Care – PPO | Source: Home / Self Care | Admitting: Obstetrics & Gynecology

## 2021-09-25 ENCOUNTER — Inpatient Hospital Stay (HOSPITAL_COMMUNITY): Payer: BC Managed Care – PPO

## 2021-09-30 ENCOUNTER — Other Ambulatory Visit: Payer: Self-pay

## 2021-09-30 ENCOUNTER — Inpatient Hospital Stay (HOSPITAL_COMMUNITY)
Admission: AD | Admit: 2021-09-30 | Discharge: 2021-10-03 | DRG: 807 | Disposition: A | Discharge status: Home / Self Care | Payer: BC Managed Care – PPO | Attending: Obstetrics and Gynecology | Admitting: Obstetrics and Gynecology

## 2021-09-30 ENCOUNTER — Other Ambulatory Visit: Payer: Self-pay | Admitting: Obstetrics & Gynecology

## 2021-09-30 ENCOUNTER — Encounter (HOSPITAL_COMMUNITY): Payer: Self-pay | Admitting: Obstetrics and Gynecology

## 2021-09-30 DIAGNOSIS — Z3A37 37 weeks gestation of pregnancy: Secondary | ICD-10-CM

## 2021-09-30 DIAGNOSIS — O43123 Velamentous insertion of umbilical cord, third trimester: Secondary | ICD-10-CM | POA: Diagnosis present

## 2021-09-30 DIAGNOSIS — Z20822 Contact with and (suspected) exposure to covid-19: Secondary | ICD-10-CM | POA: Diagnosis present

## 2021-09-30 DIAGNOSIS — O163 Unspecified maternal hypertension, third trimester: Secondary | ICD-10-CM | POA: Diagnosis present

## 2021-09-30 DIAGNOSIS — O24419 Gestational diabetes mellitus in pregnancy, unspecified control: Secondary | ICD-10-CM | POA: Diagnosis present

## 2021-09-30 DIAGNOSIS — Z87891 Personal history of nicotine dependence: Secondary | ICD-10-CM | POA: Diagnosis not present

## 2021-09-30 DIAGNOSIS — O24420 Gestational diabetes mellitus in childbirth, diet controlled: Secondary | ICD-10-CM | POA: Diagnosis present

## 2021-09-30 DIAGNOSIS — O1002 Pre-existing essential hypertension complicating childbirth: Principal | ICD-10-CM | POA: Diagnosis present

## 2021-09-30 LAB — PROTEIN / CREATININE RATIO, URINE
Creatinine, Urine: 40.73 mg/dL
Protein Creatinine Ratio: 0.27 mg/mg{Cre} — ABNORMAL HIGH (ref 0.00–0.15)
Total Protein, Urine: 11 mg/dL

## 2021-09-30 LAB — TYPE AND SCREEN
ABO/RH(D): B POS
Antibody Screen: NEGATIVE

## 2021-09-30 LAB — COMPREHENSIVE METABOLIC PANEL
ALT: 12 U/L (ref 0–44)
AST: 18 U/L (ref 15–41)
Albumin: 2.7 g/dL — ABNORMAL LOW (ref 3.5–5.0)
Alkaline Phosphatase: 143 U/L — ABNORMAL HIGH (ref 38–126)
Anion gap: 9 (ref 5–15)
BUN: 14 mg/dL (ref 6–20)
CO2: 20 mmol/L — ABNORMAL LOW (ref 22–32)
Calcium: 9.2 mg/dL (ref 8.9–10.3)
Chloride: 105 mmol/L (ref 98–111)
Creatinine, Ser: 0.7 mg/dL (ref 0.44–1.00)
GFR, Estimated: >60 mL/min (ref >60)
Glucose, Bld: 78 mg/dL (ref 70–99)
Potassium: 4.2 mmol/L (ref 3.5–5.1)
Sodium: 134 mmol/L — ABNORMAL LOW (ref 135–145)
Total Bilirubin: 0.1 mg/dL — ABNORMAL LOW (ref 0.3–1.2)
Total Protein: 6.8 g/dL (ref 6.5–8.1)

## 2021-09-30 LAB — CBC
HCT: 41.2 % (ref 36.0–46.0)
Hemoglobin: 13 g/dL (ref 12.0–15.0)
MCH: 26.4 pg (ref 26.0–34.0)
MCHC: 31.6 g/dL (ref 30.0–36.0)
MCV: 83.7 fL (ref 80.0–100.0)
Platelets: 220 10*3/uL (ref 150–400)
RBC: 4.92 MIL/uL (ref 3.87–5.11)
RDW: 15.2 % (ref 11.5–15.5)
WBC: 9.4 10*3/uL (ref 4.0–10.5)
nRBC: 0 % (ref 0.0–0.2)

## 2021-09-30 LAB — RESP PANEL BY RT-PCR (FLU A&B, COVID) ARPGX2
Influenza A by PCR: NEGATIVE
Influenza B by PCR: NEGATIVE
SARS Coronavirus 2 by RT PCR: NEGATIVE

## 2021-09-30 LAB — GLUCOSE, CAPILLARY: Glucose-Capillary: 91 mg/dL (ref 70–99)

## 2021-09-30 MED ORDER — OXYCODONE-ACETAMINOPHEN 5-325 MG PO TABS: 2.0000 | ORAL_TABLET | ORAL | Status: DC | PRN

## 2021-09-30 MED ORDER — ACETAMINOPHEN 325 MG PO TABS: 650.0000 mg | ORAL_TABLET | ORAL | Status: DC | PRN

## 2021-09-30 MED ORDER — ONDANSETRON HCL 4 MG/2ML IJ SOLN: 4.0000 mg | Freq: Four times a day (QID) | INTRAMUSCULAR | Status: DC | PRN

## 2021-09-30 MED ORDER — TERBUTALINE SULFATE 1 MG/ML IJ SOLN: 0.2500 mg | Freq: Once | INTRAMUSCULAR | Status: DC | PRN

## 2021-09-30 MED ORDER — FLEET ENEMA 7-19 GM/118ML RE ENEM: 1.0000 | ENEMA | RECTAL | Status: DC | PRN

## 2021-09-30 MED ORDER — OXYTOCIN BOLUS FROM INFUSION
333.0000 mL | Freq: Once | INTRAVENOUS | Status: AC
  Administered 2021-10-01: 333 mL via INTRAVENOUS

## 2021-09-30 MED ORDER — LACTATED RINGERS IV SOLN: 500.0000 mL | INTRAVENOUS | Status: DC | PRN

## 2021-09-30 MED ORDER — SOD CITRATE-CITRIC ACID 500-334 MG/5ML PO SOLN: 30.0000 mL | ORAL | Status: DC | PRN

## 2021-09-30 MED ORDER — OXYTOCIN-SODIUM CHLORIDE 30-0.9 UT/500ML-% IV SOLN: 2.5000 [IU]/h | INTRAVENOUS | Status: DC

## 2021-09-30 MED ORDER — LIDOCAINE HCL (PF) 1 % IJ SOLN: 30.0000 mL | INTRAMUSCULAR | Status: DC | PRN

## 2021-09-30 MED ORDER — LACTATED RINGERS IV SOLN: INTRAVENOUS | Status: DC

## 2021-09-30 MED ORDER — MISOPROSTOL 25 MCG QUARTER TABLET
25.0000 ug | ORAL_TABLET | ORAL | Status: DC | PRN
  Administered 2021-09-30: 25 ug via VAGINAL
  Administered 2021-10-01: 400 ug via VAGINAL
  Filled 2021-09-30: qty 1

## 2021-09-30 MED ORDER — LACTATED RINGERS IV SOLN
500.0000 mL | INTRAVENOUS | Status: DC | PRN
  Administered 2021-10-01: 500 mL via INTRAVENOUS

## 2021-09-30 MED ORDER — OXYCODONE-ACETAMINOPHEN 5-325 MG PO TABS: 1.0000 | ORAL_TABLET | ORAL | Status: DC | PRN

## 2021-09-30 MED ORDER — MISOPROSTOL 25 MCG QUARTER TABLET
ORAL_TABLET | ORAL | Status: AC
  Administered 2021-10-01: 25 ug via VAGINAL
  Filled 2021-09-30: qty 1

## 2021-09-30 MED ORDER — OXYTOCIN BOLUS FROM INFUSION: 333.0000 mL | Freq: Once | INTRAVENOUS | Status: DC

## 2021-09-30 NOTE — H&P (Addendum)
Sarah Hodge is a 35 y.o. female presenting for IOL at 37.6 wks due to worsening chronic hypertension, headache not resolved by Tylenol and swelling getting worse, patient getting anxious and one severe range BP of 160/98 in office on repeat assessment. She was originally scheduled for IOL on 11/13.   G2P1001, One term SVD with severe preeclampsia at term needing multiple agents to control and has chronic HTN since that delivery.  Pt has CHTN, on Procardia 30mg  XL since pre-pregnancy. Has seen cardiologist. Labetalol was added in pregnancy, BP well controlled in 130s/ 80s range until today  Pregnancy also complicated by 1) AMA- insufficient fetal fractions on NIPT, saw MFM, repeat NIPT nl at MFM but had amniocentesis by choice and 1st attempt failed, has amnion-chorion separation, so was anxious about preterm labor until it fused at 30 wks. She had 2nd amniocentesis at 18 wks with nl karyotype.  2) CHTN, AMA- Saw MFM for anatomy and follow up. Serial growth and Antesting w/ NST and BPP were in office and 10/10 - last growth 36.6 wks 6'5" at 37% AC 63% Vx, AFI nl  3) A1GDM  4) Baby wads breech since 28 wks but spontaneously verted to cephalic at 36.4 wks  5) Marginal CI  - growth AGA   OB History     Gravida  2   Para  1   Term  1   Preterm  0   AB  0   Living  1      SAB  0   IAB  0   Ectopic  0   Multiple  0   Live Births  1          Past Medical History:  Diagnosis Date   Antepartum mild preeclampsia, third trimester 02/24/2018   Past Surgical History:  Procedure Laterality Date   NO PAST SURGERIES     WISDOM TOOTH EXTRACTION     Family History: family history includes Hyperlipidemia in her father and mother. Social History:  reports that she has quit smoking. Her smoking use included cigarettes. She has never used smokeless tobacco. She reports that she does not currently use alcohol. She reports that she does not use drugs.     Maternal Diabetes: Yes:   Diabetes Type:  Diet controlled Genetic Screening: Normal  - initial NIPT was inadequate fractions, repeat was nl, but did amniocentesis x 2 attempts and was nl karyotype, XX Maternal Ultrasounds/Referrals: Normal Fetal Ultrasounds or other Referrals:  Referred to Materal Fetal Medicine  Maternal Substance Abuse:  No Significant Maternal Medications:  Meds include: Other: Procardia 30mg  XL, Labetalol 100mg  po bid  Significant Maternal Lab Results:  Group B Strep negative Other Comments:  None  Review of Systems History Dilation: Fingertip Effacement (%): Thick Station: -2 Exam by:: , RN Blood pressure (!) 141/98, pulse 71, resp. rate 18, height 5' 8.5" (1.74 m), weight 86.6 kg, last menstrual period 01/08/2021, unknown if currently breastfeeding. Exam Physical Exam  BP (!) 141/98   Pulse 71   Resp 18   Ht 5' 8.5" (1.74 m)   Wt 86.6 kg   LMP 01/08/2021 (Exact Date)   BMI 28.62 kg/m  Office exam   A&O x 3, no acute distress. Pleasant HEENT neg, no thyromegaly Lungs CTA bilat CV RRR, S1S2 normal Abdo soft, non tender, non acute Extr no edema/ tenderness Pelvic Cx closed, soft, long, Vx, -3 FHT  130s + accels no decels mod variability- cat I Toco rare  Prenatal labs: ABO, Rh: --/--/B POS (11/09 1843) Antibody: NEG (11/09 1843) Rubella: Immune (05/02 0000) RPR: Nonreactive (05/02 0000)  HBsAg: Negative (05/02 0000)  HIV: Non-reactive (05/02 0000)  GBS: Negative/-- (10/26 0000)  3hr GTT failed  Amniocentesis - nl 46 XX   Assessment/Plan: 35 yo G2P1001, 37.6 wks, chronic HTN,  with worsening BPs and headache not resolved by Tylenol. Admitted for induction Orders and management per on call MD Dr Junie Bame 09/30/2021, 9:06 PM

## 2021-09-30 NOTE — H&P (Signed)
Sarah Hodge is a 35 y.o. female presenting for [facility] with exacerbation. On low doses multiple anti HTN meds. Mild HA today now resolved. Admitted per Dr Juliene Pina for cervical ripening and IOL. Labs on admission nl.  OB History     Gravida  2   Para  1   Term  1   Preterm  0   AB  0   Living  1      SAB  0   IAB  0   Ectopic  0   Multiple  0   Live Births  1          Past Medical History:  Diagnosis Date   Antepartum mild preeclampsia, third trimester 02/24/2018   Past Surgical History:  Procedure Laterality Date   NO PAST SURGERIES     WISDOM TOOTH EXTRACTION     Family History: family history includes Hyperlipidemia in her father and mother. Social History:  reports that she has quit smoking. Her smoking use included cigarettes. She has never used smokeless tobacco. She reports that she does not currently use alcohol. She reports that she does not use drugs.     Maternal Diabetes: Yes:  Diabetes Type:  Diet controlled Genetic Screening: Normal low fetal fraction with nl amnio Maternal Ultrasounds/Referrals: Normal Fetal Ultrasounds or other Referrals:  Referred to Materal Fetal Medicine  Maternal Substance Abuse:  No Significant Maternal Medications:  Meds include: Other:  Nifedipine and Labetalol Significant Maternal Lab Results:  Group B Strep negative Other Comments:  None  Review of Systems  Constitutional: Negative.   All other systems reviewed and are negative. Maternal Medical History:  Contractions: Frequency: rare.   Perceived severity is mild.   Fetal activity: Perceived fetal activity is normal.   Last perceived fetal movement was within the past hour.   Prenatal complications: PIH.   Prenatal Complications - Diabetes: none.  Dilation: Fingertip Effacement (%): Thick Station: -2 Exam by:: Lorina Rabon, RN Blood pressure (!) 141/98, pulse 71, resp. rate 18, height 5' 8.5" (1.74 m), weight 86.6 kg, last menstrual period 01/08/2021, unknown if  currently breastfeeding. Maternal Exam:  Uterine Assessment: Contraction strength is mild.  Contraction frequency is rare.  Abdomen: Patient reports no abdominal tenderness. Fetal presentation: vertex Introitus: Normal vulva. Normal vagina.  Nitrazine test: not done. Pelvis: adequate for delivery.   Cervix: Cervix evaluated by digital exam.    Physical Exam Constitutional:      Appearance: Normal appearance. She is normal weight.  HENT:     Head: Normocephalic and atraumatic.  Cardiovascular:     Rate and Rhythm: Normal rate and regular rhythm.  Pulmonary:     Effort: Pulmonary effort is normal.     Breath sounds: Normal breath sounds.  Abdominal:     General: Bowel sounds are normal.     Palpations: Abdomen is soft.  Genitourinary:    General: Normal vulva.     Rectum: Normal.  Musculoskeletal:        General: Normal range of motion.     Cervical back: Normal range of motion and neck supple.  Skin:    General: Skin is warm and dry.  Neurological:     General: No focal deficit present.     Mental Status: She is alert and oriented to person, place, and time.  Psychiatric:        Mood and Affect: Mood normal.        Behavior: Behavior normal.    Prenatal labs: ABO, Rh: --/--/  B POS (11/09 1843) Antibody: NEG (11/09 1843) Rubella: Immune (05/02 0000) RPR: Nonreactive (05/02 0000)  HBsAg: Negative (05/02 0000)  HIV: Non-reactive (05/02 0000)  GBS: Negative/-- (10/26 0000)   Assessment/Plan: CHTN with exacerbation. History of PEC in prior pregnancy History of low fetal fraction on NIPS with nl amnio and MFM fu GDM- diet controlled Unfavorable cervix Admit for IOL   [name] 09/30/2021, 9:06 PM

## 2021-10-01 ENCOUNTER — Inpatient Hospital Stay (HOSPITAL_COMMUNITY): Payer: BC Managed Care – PPO | Admitting: Anesthesiology

## 2021-10-01 ENCOUNTER — Encounter (HOSPITAL_COMMUNITY): Payer: Self-pay | Admitting: Obstetrics and Gynecology

## 2021-10-01 LAB — COMPREHENSIVE METABOLIC PANEL
ALT: 12 U/L (ref 0–44)
AST: 17 U/L (ref 15–41)
Albumin: 2.5 g/dL — ABNORMAL LOW (ref 3.5–5.0)
Alkaline Phosphatase: 135 U/L — ABNORMAL HIGH (ref 38–126)
Anion gap: 9 (ref 5–15)
BUN: 9 mg/dL (ref 6–20)
CO2: 23 mmol/L (ref 22–32)
Calcium: 8.9 mg/dL (ref 8.9–10.3)
Chloride: 104 mmol/L (ref 98–111)
Creatinine, Ser: 0.79 mg/dL (ref 0.44–1.00)
GFR, Estimated: >60 mL/min (ref >60)
Glucose, Bld: 81 mg/dL (ref 70–99)
Potassium: 4.5 mmol/L (ref 3.5–5.1)
Sodium: 136 mmol/L (ref 135–145)
Total Bilirubin: 0.4 mg/dL (ref 0.3–1.2)
Total Protein: 6.4 g/dL — ABNORMAL LOW (ref 6.5–8.1)

## 2021-10-01 LAB — GLUCOSE, CAPILLARY
Glucose-Capillary: 79 mg/dL (ref 70–99)
Glucose-Capillary: 73 mg/dL (ref 70–99)
Glucose-Capillary: 65 mg/dL — ABNORMAL LOW (ref 70–99)
Glucose-Capillary: 79 mg/dL (ref 70–99)

## 2021-10-01 LAB — CBC
HCT: 40.9 % (ref 36.0–46.0)
Hemoglobin: 13.1 g/dL (ref 12.0–15.0)
MCH: 26.5 pg (ref 26.0–34.0)
MCHC: 32 g/dL (ref 30.0–36.0)
MCV: 82.8 fL (ref 80.0–100.0)
Platelets: 188 10*3/uL (ref 150–400)
RBC: 4.94 MIL/uL (ref 3.87–5.11)
RDW: 15.4 % (ref 11.5–15.5)
WBC: 10.9 10*3/uL — ABNORMAL HIGH (ref 4.0–10.5)
nRBC: 0 % (ref 0.0–0.2)

## 2021-10-01 LAB — RPR: RPR Ser Ql: NONREACTIVE

## 2021-10-01 MED ORDER — WITCH HAZEL-GLYCERIN EX PADS: 1.0000 "application " | MEDICATED_PAD | CUTANEOUS | Status: DC | PRN

## 2021-10-01 MED ORDER — ONDANSETRON HCL 4 MG PO TABS
4.0000 mg | ORAL_TABLET | ORAL | Status: DC | PRN
  Administered 2021-10-01: 4 mg via ORAL
  Filled 2021-10-01: qty 1

## 2021-10-01 MED ORDER — DIPHENHYDRAMINE HCL 25 MG PO CAPS: 25.0000 mg | ORAL_CAPSULE | Freq: Four times a day (QID) | ORAL | Status: DC | PRN

## 2021-10-01 MED ORDER — TERBUTALINE SULFATE 1 MG/ML IJ SOLN: 0.2500 mg | Freq: Once | INTRAMUSCULAR | Status: DC | PRN

## 2021-10-01 MED ORDER — LABETALOL HCL 100 MG PO TABS
100.0000 mg | ORAL_TABLET | Freq: Two times a day (BID) | ORAL | Status: DC
  Administered 2021-10-01 (×2): 100 mg via ORAL
  Filled 2021-10-01 (×2): qty 1

## 2021-10-01 MED ORDER — BENZOCAINE-MENTHOL 20-0.5 % EX AERO
1.0000 "application " | INHALATION_SPRAY | CUTANEOUS | Status: DC | PRN
  Administered 2021-10-01: 1 via TOPICAL
  Filled 2021-10-01: qty 56

## 2021-10-01 MED ORDER — NIFEDIPINE ER OSMOTIC RELEASE 30 MG PO TB24
30.0000 mg | ORAL_TABLET | Freq: Every day | ORAL | Status: DC
  Administered 2021-10-01 – 2021-10-03 (×3): 30 mg via ORAL
  Filled 2021-10-01 (×4): qty 1

## 2021-10-01 MED ORDER — SIMETHICONE 80 MG PO CHEW: 80.0000 mg | CHEWABLE_TABLET | ORAL | Status: DC | PRN

## 2021-10-01 MED ORDER — OXYTOCIN-SODIUM CHLORIDE 30-0.9 UT/500ML-% IV SOLN
1.0000 m[IU]/min | INTRAVENOUS | Status: DC
  Administered 2021-10-01: 2 m[IU]/min via INTRAVENOUS
  Filled 2021-10-01 (×2): qty 500

## 2021-10-01 MED ORDER — DIPHENHYDRAMINE HCL 50 MG/ML IJ SOLN: 12.5000 mg | INTRAMUSCULAR | Status: DC | PRN

## 2021-10-01 MED ORDER — SENNOSIDES-DOCUSATE SODIUM 8.6-50 MG PO TABS
2.0000 | ORAL_TABLET | ORAL | Status: DC
  Administered 2021-10-02 – 2021-10-03 (×2): 2 via ORAL
  Filled 2021-10-01 (×2): qty 2

## 2021-10-01 MED ORDER — OXYCODONE HCL 5 MG PO TABS: 5.0000 mg | ORAL_TABLET | ORAL | Status: DC | PRN

## 2021-10-01 MED ORDER — IBUPROFEN 600 MG PO TABS
600.0000 mg | ORAL_TABLET | Freq: Four times a day (QID) | ORAL | Status: DC
  Administered 2021-10-01 – 2021-10-03 (×7): 600 mg via ORAL
  Filled 2021-10-01 (×7): qty 1

## 2021-10-01 MED ORDER — BISACODYL 10 MG RE SUPP: 10.0000 mg | Freq: Every day | RECTAL | Status: DC | PRN

## 2021-10-01 MED ORDER — PRENATAL MULTIVITAMIN CH
1.0000 | ORAL_TABLET | Freq: Every day | ORAL | Status: DC
  Administered 2021-10-02 – 2021-10-03 (×2): 1 via ORAL
  Filled 2021-10-01 (×2): qty 1

## 2021-10-01 MED ORDER — ZOLPIDEM TARTRATE 5 MG PO TABS: 5.0000 mg | ORAL_TABLET | Freq: Every evening | ORAL | Status: DC | PRN

## 2021-10-01 MED ORDER — COCONUT OIL OIL: 1.0000 "application " | TOPICAL_OIL | Status: DC | PRN

## 2021-10-01 MED ORDER — MISOPROSTOL 200 MCG PO TABS
ORAL_TABLET | ORAL | Status: AC
  Filled 2021-10-01: qty 2

## 2021-10-01 MED ORDER — FLEET ENEMA 7-19 GM/118ML RE ENEM: 1.0000 | ENEMA | Freq: Every day | RECTAL | Status: DC | PRN

## 2021-10-01 MED ORDER — ONDANSETRON HCL 4 MG/2ML IJ SOLN: 4.0000 mg | INTRAMUSCULAR | Status: DC | PRN

## 2021-10-01 MED ORDER — PHENYLEPHRINE 40 MCG/ML (10ML) SYRINGE FOR IV PUSH (FOR BLOOD PRESSURE SUPPORT): 80.0000 ug | PREFILLED_SYRINGE | INTRAVENOUS | Status: DC | PRN

## 2021-10-01 MED ORDER — LABETALOL HCL 5 MG/ML IV SOLN
40.0000 mg | INTRAVENOUS | Status: DC | PRN
  Filled 2021-10-01: qty 8

## 2021-10-01 MED ORDER — HYDRALAZINE HCL 20 MG/ML IJ SOLN: 10.0000 mg | INTRAMUSCULAR | Status: DC | PRN

## 2021-10-01 MED ORDER — LACTATED RINGERS IV SOLN
500.0000 mL | Freq: Once | INTRAVENOUS | Status: AC
  Administered 2021-10-01: 500 mL via INTRAVENOUS

## 2021-10-01 MED ORDER — DIBUCAINE (PERIANAL) 1 % EX OINT: 1.0000 "application " | TOPICAL_OINTMENT | CUTANEOUS | Status: DC | PRN

## 2021-10-01 MED ORDER — EPHEDRINE 5 MG/ML INJ: 10.0000 mg | INTRAVENOUS | Status: DC | PRN

## 2021-10-01 MED ORDER — LABETALOL HCL 5 MG/ML IV SOLN
80.0000 mg | INTRAVENOUS | Status: DC | PRN
  Filled 2021-10-01: qty 16

## 2021-10-01 MED ORDER — TETANUS-DIPHTH-ACELL PERTUSSIS 5-2.5-18.5 LF-MCG/0.5 IM SUSY: 0.5000 mL | PREFILLED_SYRINGE | Freq: Once | INTRAMUSCULAR | Status: DC

## 2021-10-01 MED ORDER — LIDOCAINE HCL (PF) 1 % IJ SOLN
INTRAMUSCULAR | Status: DC | PRN
  Administered 2021-10-01 (×2): 5 mL via EPIDURAL

## 2021-10-01 MED ORDER — FENTANYL-BUPIVACAINE-NACL 0.5-0.125-0.9 MG/250ML-% EP SOLN
12.0000 mL/h | EPIDURAL | Status: DC | PRN
  Administered 2021-10-01: 12 mL/h via EPIDURAL
  Filled 2021-10-01: qty 250

## 2021-10-01 MED ORDER — ACETAMINOPHEN 500 MG PO TABS
1000.0000 mg | ORAL_TABLET | Freq: Four times a day (QID) | ORAL | Status: DC
  Administered 2021-10-01 – 2021-10-03 (×5): 1000 mg via ORAL
  Filled 2021-10-01 (×5): qty 2

## 2021-10-01 MED ORDER — LABETALOL HCL 5 MG/ML IV SOLN
20.0000 mg | INTRAVENOUS | Status: DC | PRN
  Administered 2021-10-01: 20 mg via INTRAVENOUS

## 2021-10-01 MED ORDER — PHENYLEPHRINE 40 MCG/ML (10ML) SYRINGE FOR IV PUSH (FOR BLOOD PRESSURE SUPPORT)
80.0000 ug | PREFILLED_SYRINGE | INTRAVENOUS | Status: DC | PRN
  Administered 2021-10-01 (×2): 80 ug via INTRAVENOUS
  Filled 2021-10-01: qty 10

## 2021-10-01 MED ORDER — LABETALOL HCL 5 MG/ML IV SOLN
INTRAVENOUS | Status: AC
  Administered 2021-10-01: 40 mg via INTRAVENOUS
  Filled 2021-10-01: qty 4

## 2021-10-01 MED ORDER — FENTANYL CITRATE (PF) 100 MCG/2ML IJ SOLN
100.0000 ug | INTRAMUSCULAR | Status: DC | PRN
  Administered 2021-10-01 (×2): 100 ug via INTRAVENOUS
  Filled 2021-10-01 (×2): qty 2

## 2021-10-01 NOTE — Anesthesia Procedure Notes (Signed)
Epidural Patient location during procedure: OB Start time: 10/01/2021 10:33 AM End time: 10/01/2021 10:42 AM  Staffing Anesthesiologist: Mal Amabile, MD Performed: anesthesiologist   Preanesthetic Checklist Completed: patient identified, IV checked, site marked, risks and benefits discussed, surgical consent, monitors and equipment checked, pre-op evaluation and timeout performed  Epidural Patient position: sitting Prep: DuraPrep and site prepped and draped Patient monitoring: continuous pulse ox and blood pressure Approach: midline Location: L3-L4 Injection technique: LOR air  Needle:  Needle type: Tuohy  Needle gauge: 17 G Needle length: 9 cm and 9 Needle insertion depth: 5 cm Catheter type: closed end flexible Catheter size: 19 Gauge Catheter at skin depth: 9 cm Test dose: negative and Other  Assessment Events: blood not aspirated, injection not painful, no injection resistance, no paresthesia and negative IV test  Additional Notes Patient identified. Risks and benefits discussed including failed block, incomplete  Pain control, post dural puncture headache, nerve damage, paralysis, blood pressure Changes, nausea, vomiting, reactions to medications-both toxic and allergic and post Partum back pain. All questions were answered. Patient expressed understanding and wished to proceed. Sterile technique was used throughout procedure. Epidural site was Dressed with sterile barrier dressing. No paresthesias, signs of intravascular injection Or signs of intrathecal spread were encountered.  Patient was more comfortable after the epidural was dosed. Please see RN's note for documentation of vital signs and FHR which are stable. Reason for block:procedure for pain

## 2021-10-01 NOTE — Progress Notes (Signed)
Sarah Hodge is a 35 y.o. G2P1001 at [redacted]w[redacted]d by ultrasound admitted for  IOL 2/2 worsening BP in setting of chronic HTN IOL overnight cervical ripening with cytotec, Pitocin started at 0630. Received couple doses IV labetalol this am for severe range pressures, now improved since home meds continued. Subjective: Comfortable with epidural and resting in lateral position. Feels better after having light breakfast.  Denies HA/NV/RUQ pain/visual changes.   Discussed AROM for augmentation and agrees.  Spouse at [facility] and supportive. Expecting baby girl Meera.   Objective: Vitals:   10/01/21 1101 10/01/21 1106 10/01/21 1110 10/01/21 1115  BP: 117/69 119/77 115/76 119/80  Pulse: 82 89 86 87  Resp:      Temp:      TempSrc:      SpO2:      Weight:      Height:         FHT:  FHR: 140 bpm, variability: moderate,  accelerations:  Present,  decelerations:  Present some late decels after epidural with hypotensive episode, resolved with fluids and phenylephrine x 1 dose UC:   regular, every 2-4 minutes SVE:   Dilation: 3 Effacement (%): 70 Station: -2 Exam by:: Arlan Organ, CNM AROM clear fluid, large amount. Vertex  Labs:   Recent Labs    09/30/21 1823  WBC 9.4  HGB 13.0  HCT 41.2  PLT 220  CMP     Component Value Date/Time   NA 134 (L) 09/30/2021 1823   K 4.2 09/30/2021 1823   CL 105 09/30/2021 1823   CO2 20 (L) 09/30/2021 1823    GLUCOSE 78 09/30/2021 1823   BUN 14 09/30/2021 1823   CREATININE 0.70 09/30/2021 1823  CALCIUM 9.2 09/30/2021 1823   PROT 6.8 09/30/2021 1823   ALBUMIN 2.7 (L) 09/30/2021 1823   AST 18 09/30/2021 1823   ALT 12 09/30/2021 1823   ALKPHOS 143 (H) 09/30/2021 1823   BILITOT 0.1 (L) 09/30/2021 1823   GFRNONAA >60 09/30/2021 1823   GFRAA >60 02/27/2018 0532   PCR 0.27 CBG (last 3)  Recent Labs    09/30/21 2233 10/01/21 0223 10/01/21 0845  GLUCAP 91 79 73    Assessment / Plan: G2P1001 35 y.o. [redacted]w[redacted]d IOL for exacerbation of CHTN GDM A1, CBG stable Labor:  Good ripening effect overnight, Pitocin titration per protocol, now w/ AROM Preeclampsia:  no signs or symptoms of toxicity, intake and ouput balanced, and labs stable, will rpt labs this AM for trend Fetal Wellbeing:  Category I and Category II Pain Control:  Epidural I/D:   GBS neg Anticipated MOD:  NSVD  Neta Mends, CNM, MSN 10/01/2021, 11:27 AM

## 2021-10-01 NOTE — Anesthesia Preprocedure Evaluation (Signed)
Anesthesia Evaluation  Patient identified by MRN, date of birth, ID band Patient awake    Reviewed: Allergy & Precautions, Patient's Chart, lab work & pertinent test results  Airway Mallampati: II  TM Distance: >3 FB Neck ROM: Full    Dental no notable dental hx. (+) Teeth Intact   Pulmonary former smoker,    Pulmonary exam normal breath sounds clear to auscultation- rhonchi       Cardiovascular hypertension, Pt. on medications and Pt. on home beta blockers Normal cardiovascular exam Rhythm:Regular Rate:Normal     Neuro/Psych negative neurological ROS  negative psych ROS   GI/Hepatic Neg liver ROS, GERD  ,  Endo/Other  diabetes, Well Controlled, Gestational  Renal/GU negative Renal ROS  negative genitourinary   Musculoskeletal negative musculoskeletal ROS (+)   Abdominal   Peds  Hematology  (+) anemia ,   Anesthesia Other Findings   Reproductive/Obstetrics (+) Pregnancy 37 6/7/ weeks cHTN with superimposed Pre E AMA                             Anesthesia Physical Anesthesia Plan  ASA: 2  Anesthesia Plan: Epidural   Post-op Pain Management:    Induction:   PONV Risk Score and Plan:   Airway Management Planned: Natural Airway  Additional Equipment:   Intra-op Plan:   Post-operative Plan:   Informed Consent: I have reviewed the patients History and Physical, chart, labs and discussed the procedure including the risks, benefits and alternatives for the proposed anesthesia with the patient or authorized representative who has indicated his/her understanding and acceptance.       Plan Discussed with: Anesthesiologist  Anesthesia Plan Comments:         Anesthesia Quick Evaluation

## 2021-10-01 NOTE — Lactation Note (Addendum)
This note was copied from a baby's chart. Lactation Consultation Note  Patient Name: Sarah Hodge [id number] Date: 10/01/2021 Reason for consult: L&D Initial assessment, GDM Age:36 hours Per mom, she feels infant is breastfeeding well,  infant BF in L&D for 15 minutes, then on MBU for 14 minutes at 1921 pm. When [facility] entered the room, infant was still cuing to breastfeed, mom latched infant on her left breast using the cradle hold position, infant latched with depth, mom felt tug only and infant was still breastfeeding after 12 minutes when LC left the room. Mom knows to breastfeed infant according to feeding cues, 8 to 12+ or more times within 24 hours, skin to skin. Mom requested hand pump and shown how to use mom easily expressed few drops of colostrum using hand pump  which she will finger feed to infant after latching infant at the breast. Mom shown how to use hand pump & how to disassemble, clean, & reassemble parts.  Mom knows to call RN/LC if she has any breastfeeding questions, concerns or needs further assistance with latching infant at the breast.  Mom made aware of O/P services, breastfeeding support groups, community resources, and our phone # for post-discharge questions.   Maternal Data Does the patient have breastfeeding experience prior to this delivery?: Yes How long did the patient breastfeed?: Per mom, she had latch difficulties with her son she breastfeed for one month and pumped for 10 months.  Feeding Mother's Current Feeding Choice: Breast Milk  LATCH Score Latch: Grasps breast easily, tongue down, lips flanged, rhythmical sucking.  Audible Swallowing: Spontaneous and intermittent  Type of Nipple: Everted at rest and after stimulation  Comfort (Breast/Nipple): Soft / non-tender  Hold (Positioning): Assistance needed to correctly position infant at breast and maintain latch.  LATCH Score: 9   Lactation Tools Discussed/Used Tools: Pump Breast pump type:  Manual Pump Education: Setup, frequency, and cleaning;Milk Storage Reason for Pumping: Mom requested hand pump  Interventions Interventions: Breast feeding basics reviewed;Assisted with latch;Skin to skin;Breast compression;Support pillows;Adjust position;Position options;Education;Hand pump;LC Services brochure  Discharge Pump: Manual (Mom requested hand pump.) WIC Program: No  Consult Status Consult Status: Follow-up Date: 10/02/21 Follow-up type: In-patient    Danelle Earthly 10/01/2021, 8:10 PM

## 2021-10-02 LAB — CBC
HCT: 35.7 % — ABNORMAL LOW (ref 36.0–46.0)
Hemoglobin: 11.4 g/dL — ABNORMAL LOW (ref 12.0–15.0)
MCH: 26.6 pg (ref 26.0–34.0)
MCHC: 31.9 g/dL (ref 30.0–36.0)
MCV: 83.2 fL (ref 80.0–100.0)
Platelets: 189 10*3/uL (ref 150–400)
RBC: 4.29 MIL/uL (ref 3.87–5.11)
RDW: 15.2 % (ref 11.5–15.5)
WBC: 12.1 10*3/uL — ABNORMAL HIGH (ref 4.0–10.5)
nRBC: 0 % (ref 0.0–0.2)

## 2021-10-02 MED ORDER — LABETALOL HCL 100 MG PO TABS
100.0000 mg | ORAL_TABLET | Freq: Two times a day (BID) | ORAL | Status: DC
  Administered 2021-10-02 – 2021-10-03 (×3): 100 mg via ORAL
  Filled 2021-10-02 (×3): qty 1

## 2021-10-02 NOTE — Progress Notes (Signed)
PPD # 1 S/P NSVD  Live born female  Birth Weight: 6 lb 10.5 oz (3020 g) APGAR: 9, 9  Newborn Delivery   Birth date/time: 10/[birth year]/2021 16:02:00 Delivery type: Vaginal, Spontaneous     Baby name: Sarah Hodge Delivering provider: Arlan Organ C  Episiotomy:None   Lacerations:2nd degree;Perineal  Feeding: breast  Pain control at delivery: None   S:  Reports feeling very well, happy with birth experience.  Denies HA/NV/RUQ pain/visual changes.               Tolerating po/ No nausea or vomiting             Bleeding is light             Pain controlled with acetaminophen and ibuprofen (OTC)             Up ad lib / ambulatory / voiding without difficulties   O:  A & O x 3, in no apparent distress              VS:  Vitals:   10/01/21 1903 10/01/21 2306 10/02/21 0315 10/02/21 0655  BP: (!) 139/95 (!) 139/95 (!) 124/91 132/83  Pulse: 87 91 81 78  Resp: 17 18 17 16   Temp:  98.6 F (37 C) 98 F (36.7 C) 97.9 F (36.6 C)  TempSrc:  Oral Oral Oral  SpO2: 98% 96% 99% 98%  Weight:      Height:        LABS:  Recent Labs    10/01/21 1154 10/02/21 0419  WBC 10.9* 12.1*  HGB 13.1 11.4*  HCT 40.9 35.7*  PLT 188 189   CMP Latest Ref Rng & Units 10/01/2021 09/30/2021 02/27/2018  Glucose 70 - 99 mg/dL 81 78 91  BUN 6 - 20 mg/dL 9 14 6   Creatinine 0.44 - 1.00 mg/dL 04/29/2018 5.05  Sodium 135 - 145 mmol/L 136 134(L) 137  Potassium 3.5 - 5.1 mmol/L 4.5 4.2 3.6  Chloride 98 - 111 mmol/L 104 105 106  CO2 22 - 32 mmol/L 23 20(L) 23  Calcium 8.9 - 10.3 mg/dL 8.9 9.2 7.0(L)  Total Protein 6.5 - 8.1 g/dL 6.4(L) 6.8 6.1(L)  Total Bilirubin 0.3 - 1.2 mg/dL 0.4 3.97) 0.5  Alkaline Phos 38 - 126 U/L 135(H) 143(H) 151(H)  AST 15 - 41 U/L 17 18 35  ALT 0 - 44 U/L 12 12 15      Blood type: --/--/B POS (11/09 1843)  Rubella: Immune (05/02 0000)   I&O: I/O last 3 completed shifts: In: 2750.2 [I.V.:2681.6; Other:68.6] Out: 1400 [Urine:1000; Blood:400]          No intake/output data  recorded.  Vaccines: TDaP          UTD         Flu             UTD                   COVID-19 UTD  Gen: AAO x 3, NAD  Abdomen: soft, non-tender, non-distended             Fundus: firm, non-tender, U-1  Perineum: repair intact  Lochia: small  Extremities: no edema, no calf pain or tenderness    A/P: PPD # 1 35 y.o., 03-25-1987   Principal Problem:   Postpartum care following vaginal delivery 11/10 Active Problems:   Gestational diabetes mellitus (GDM), antepartum  - f/u outpatient, check 2-3 FBG at home next few  days   Hypertension affecting pregnancy in third trimester  - CHTN with exacerbation prior to delivery, labs stable. Has hx of severe PEC with first delivery and needing multiple agents to control postpartum, will need close monitoring  - BP labile to mild range postpartum, no neural sx  - continue Procardia 30XL daily and labetalol 100 mg BID as prior  - close F/U in office early next week and patient will check BP at home over the weekend, to call if BP > 160/105  - POC with Dr. Juliene Pina consult   SVD (spontaneous vaginal delivery)   Perineal laceration, second degree   Doing well - stable status  Routine post partum orders  Anticipate discharge tomorrow, may consider DC home today if newborn discharged by peds office.     Neta Mends, MSN, CNM 10/02/2021, 9:42 AM

## 2021-10-02 NOTE — Anesthesia Postprocedure Evaluation (Signed)
Anesthesia Post Note  Patient: Sarah Hodge  Procedure(s) Performed: AN AD HOC LABOR EPIDURAL     Patient location during evaluation: Mother Baby Anesthesia Type: Epidural Level of consciousness: awake and alert Pain management: pain level controlled Vital Signs Assessment: post-procedure vital signs reviewed and stable Respiratory status: spontaneous breathing, nonlabored ventilation and respiratory function stable Cardiovascular status: stable Postop Assessment: no headache, no backache, epidural receding, no apparent nausea or vomiting, patient able to bend at knees, adequate PO intake and able to ambulate Anesthetic complications: no   No notable events documented.  Last Vitals:  Vitals:   10/02/21 0315 10/02/21 0655  BP: (!) 124/91 132/83  Pulse: 81 78  Resp: 17 16  Temp: 36.7 C 36.6 C  SpO2: 99% 98%    Last Pain:  Vitals:   10/02/21 0731  TempSrc:   PainSc: 0-No pain   Pain Goal: Patients Stated Pain Goal: 0 (10/01/21 0706)                 Laban Emperor

## 2021-10-02 NOTE — Lactation Note (Signed)
This note was copied from a baby's chart. Lactation Consultation Note  Patient Name: Sarah Hodge [id number] Date: 10/02/2021 Reason for consult: Follow-up assessment;Mother's request;Difficult latch;Early term 37-38.6wks;Breastfeeding assistance;Other (Comment) (Hypoglycemia) Age:36 hours  LC did some suck training to bring tongue down. We worked on different positions to get more depth, given Mom long nipples.   Mom set up on dEBP to post pump q 3 hrs for .  Mom aware to latch infant at breast first followed by supplementation with EBM and formula. BF supplementation guide reviewed based on hrs of age since delivery.  Mom also give extra slow flow nipple for next feeding, stated infant leakage with yellow nipple.   All questions answered at the end of the visit.  Maternal Data Has patient been taught Hand Expression?: Yes  Feeding Mother's Current Feeding Choice: Breast Milk and Formula Nipple Type: Slow - flow  LATCH Score                    Lactation Tools Discussed/Used Tools: Pump;Flanges Flange Size: 27 Breast pump type: Double-Electric Breast Pump Pump Education: Setup, frequency, and cleaning;Milk Storage Reason for Pumping: increase stimulation Pumping frequency: every 3 hrs for 15 min  Interventions Interventions: Breast feeding basics reviewed;Support pillows;Education;Assisted with latch;Pace feeding;Skin to skin;Expressed milk;Breast massage;Infant Driven Feeding Algorithm education;Breast compression;DEBP;Adjust position  Discharge    Consult Status Consult Status: Follow-up Date: 10/03/21 Follow-up type: In-patient    Robie Mcniel  Nicholson-Springer 10/02/2021, 9:40 PM

## 2021-10-03 MED ORDER — IBUPROFEN 600 MG PO TABS: 600.0000 mg | ORAL_TABLET | Freq: Four times a day (QID) | ORAL | 0 refills | Status: AC

## 2021-10-03 NOTE — Lactation Note (Signed)
This note was copied from a baby's chart. Lactation Consultation Note  Patient Name: Sarah Hodge [id number] Date: 10/03/2021 Reason for consult: Follow-up assessment;Early term 37-38.6wks;Maternal endocrine disorder;Nipple pain/trauma Age:36 hours  LC in to visit with P2 Mom of ET infant on day of discharge.  Baby is at 5% weight loss with good output.  Mom has been supplementing breastfeeding with 24 cal formula after breastfeeding due to unstable low blood sugars (Per Pediatrician order).  Baby's last 2 sugars have been good.  Mom started pumping last night, and last pumping expressed 6 ml.   Mom to feed baby her EBM and formula after breastfeeding.  Mom would like to exclusively breastfeed this baby as she had trouble latching her first and ended up pumping for 10 months.  Talked about F/U with OP lactation consultant and Mom is very interested. Message sent to clinic.  Engorgement prevention and treatment reviewed.  Mom aware of OP lactation support available to her and encouraged her to call for concerns. Tools Discussed/Used Tools: Pump;Bottle Breast pump type: Double-Electric Breast Pump Pump Education: Setup, frequency, and cleaning;Milk Storage Reason for Pumping: support milk supply Pumping frequency: Q 3hrs after breastfeeding Pumped volume: 6 mL  Interventions Interventions: Breast feeding basics reviewed;Skin to skin;Breast massage;Hand express;Pre-pump if needed;Hand pump;DEBP  Discharge Discharge Education: Engorgement and breast care;Outpatient recommendation;Outpatient Epic message sent;Warning signs for feeding baby Pump: Personal (Medela DEBP at home)  Consult Status Consult Status: Complete Date: 10/03/21 Follow-up type: Out-patient    [name] 10/03/2021, 2:15 PM

## 2021-10-03 NOTE — Discharge Summary (Signed)
Postpartum Discharge Summary  Date of Service updated     Patient Name: Sarah Hodge DOB: 02-19-1986 MRN: [id number]  Date of admission: 09/30/2021 Delivery date:10/01/2021  Delivering provider: Juliene Pina  Date of discharge: 10/03/2021  Admitting diagnosis: Hypertension affecting pregnancy in third trimester [O16.3] Intrauterine pregnancy: [redacted]w[redacted]d    Secondary diagnosis:  Principal Problem:   Postpartum care following vaginal delivery 11/10 Active Problems:   Gestational diabetes mellitus (GDM), antepartum   Hypertension affecting pregnancy in third trimester   SVD (spontaneous vaginal delivery)   Perineal laceration, second degree  Additional problems: none    Discharge diagnosis: Term Pregnancy Delivered, CHTN, and GDM A1                                              Post partum procedures: none Augmentation: AROM and Pitocin Complications: None  Hospital course: Induction of Labor With Vaginal Delivery   35y.o. yo G2P2002 at 36w0das admitted to the hospital 09/30/2021 for induction of labor.  Indication for induction:  worsening CHTN .  Patient had an uncomplicated labor course as follows: Membrane Rupture Time/Date: 11:19 AM ,10/01/2021   Delivery Method:Vaginal, Spontaneous  Episiotomy: None  Lacerations:  2nd degree;Perineal  Details of delivery can be found in separate delivery note.  Patient had a routine postpartum course. Patient is discharged home 10/03/21.  Newborn Data: Birth date:10/01/2021  Birth time:4:02 PM  Gender:Female  Living status:Living  Apgars:9 ,9  Weight:3020 g   Magnesium Sulfate received: No BMZ received: No Rhophylac:No MMR:No T-DaP:Given prenatally Flu: No Transfusion:No  Physical exam  Vitals:   10/02/21 1438 10/02/21 2130 10/03/21 0627 10/03/21 0954  BP: 133/82 117/85 126/84 134/87  Pulse: 66 68 70 74  Resp: _0 Temp: 98.1 F (36.7 C) 97.6 F (36.4 C) 98.3 F (36.8 C)   TempSrc: Oral Oral Oral   SpO2: 100%  100% 100%   Weight:      Height:       General: alert, cooperative, and no distress Lochia: appropriate Uterine Fundus: firm Incision: N/A DVT Evaluation: No evidence of DVT seen on physical exam. Labs: Lab Results  Component Value Date   WBC 12.1 (H) 10/02/2021   HGB 11.4 (L) 10/02/2021   HCT 35.7 (L) 10/02/2021   MCV 83.2 10/02/2021   PLT 189 10/02/2021   CMP Latest Ref Rng & Units 10/01/2021  Glucose 70 - 99 mg/dL 81  BUN 6 - 20 mg/dL 9  Creatinine 0.44 - 1.00 mg/dL 0.79  Sodium 135 - 145 mmol/L 136  Potassium 3.5 - 5.1 mmol/L 4.5  Chloride 98 - 111 mmol/L 104  CO2 22 - 32 mmol/L 23  Calcium 8.9 - 10.3 mg/dL 8.9  Total Protein 6.5 - 8.1 g/dL 6.4(L)  Total Bilirubin 0.3 - 1.2 mg/dL 0.4  Alkaline Phos 38 - 126 U/L 135(H)  AST 15 - 41 U/L 17  ALT 0 - 44 U/L 12   Edinburgh Score: Edinburgh Postnatal Depression Scale Screening Tool 10/01/2021  I have been able to laugh and see the funny side of things. 0  I have looked forward with enjoyment to things. 0  I have blamed myself unnecessarily when things went wrong. 0  I have been anxious or worried for no good reason. 2  I have felt scared or panicky for no good reason. 0  Things have been getting on top of me. 0  I have been so unhappy that I have had difficulty sleeping. 0  I have felt sad or miserable. 0  I have been so unhappy that I have been crying. 0  The thought of harming myself has occurred to me. 0  Edinburgh Postnatal Depression Scale Total 2      After visit meds:  Allergies as of 10/03/2021   No Known Allergies      Medication List     STOP taking these medications    MAGNESIUM CITRATE PO       TAKE these medications    ibuprofen 600 MG tablet Commonly known as: ADVIL Take 1 tablet (600 mg total) by mouth every 6 (six) hours.   labetalol 100 MG tablet Commonly known as: NORMODYNE Take 100 mg by mouth 2 (two) times daily.   NIFEdipine 30 MG 24 hr tablet Commonly known as:  PROCARDIA-XL/NIFEDICAL-XL Take 30 mg by mouth daily.   PRENATAL VITAMIN PO Take by mouth.         Discharge home in stable condition Infant Feeding: Breast Infant Disposition:home with mother Discharge instruction: per After Visit Summary and Postpartum booklet. Activity: Advance as tolerated. Pelvic rest for 6 weeks.  Diet: routine diet Anticipated Birth Control: Unsure Postpartum Appointment:6wk Additional Postpartum F/U:  bp check 2-3 days Future Appointments:No future appointments. Follow up Visit:  Follow-up Information     Azucena Fallen, MD Follow up.   Specialty: Obstetrics and Gynecology Contact information: [address] [place] [address] [phone]                     10/03/2021 Charyl Bigger, MD

## 2021-10-03 NOTE — Progress Notes (Signed)
No c/o; nml lochia, breastfeeding Minimal pain/cramping; no h/a, vision changes, ruq pain Voiding w/o difficulty  Patient Vitals for the past 24 hrs:  BP Temp Temp src Pulse Resp SpO2  10/03/21 0954 134/87 -- -- 74 -- --  10/03/21 0627 126/84 98.3 F (36.8 C) Oral 70 18 100 %  10/02/21 2130 117/85 97.6 F (36.4 C) Oral 68 18 100 %  10/02/21 1438 133/82 98.1 F (36.7 C) Oral 66 16 100 %   A&Ox3 Nml respiarations Abd: soft, nt, nd; fundus firm and below umb LE: no edema, nt bilat  CBC Latest Ref Rng & Units 10/02/2021 10/01/2021 09/30/2021  WBC 4.0 - 10.5 K/uL 12.1(H) 10.9(H) 9.4  Hemoglobin 12.0 - 15.0 g/dL 11.4(L) 13.1 13.0  Hematocrit 36.0 - 46.0 % 35.7(L) 40.9 41.2  Platelets 150 - 400 K/uL 189 188 220   CMP Latest Ref Rng & Units 10/01/2021 09/30/2021 02/27/2018  Glucose 70 - 99 mg/dL 81 78 91  BUN 6 - 20 mg/dL 9 14 6   Creatinine 0.44 - 1.00 mg/dL 0.62 3.76  Sodium 135 - 145 mmol/L 136 134(L) 137  Potassium 3.5 - 5.1 mmol/L 4.5 4.2 3.6  Chloride 98 - 111 mmol/L 104 105 106  CO2 22 - 32 mmol/L 23 20(L) 23  Calcium 8.9 - 10.3 mg/dL 8.9 9.2 7.0(L)  Total Protein 6.5 - 8.1 g/dL 6.4(L) 6.8 6.1(L)  Total Bilirubin 0.3 - 1.2 mg/dL 0.4 2.83) 0.5  Alkaline Phos 38 - 126 U/L 135(H) 143(H) 151(H)  AST 15 - 41 U/L 17 18 35  ALT 0 - 44 U/L 12 12 15    A/P: pp d 2 s/p svd Doing well - d/c home today Chtn with recent exacerbation - well controlled currently on labetalol/procardia - plan current therapy and close f/u in 2-3 days at office; pt to follow bps over weekend also and parameters given when to call; h/o multiple agents pp last pregnancy GDMA - pt will periodically check 2-3 fsbs and can f/u nxt wk to see if needs to continue RH pos RI

## 2021-10-04 ENCOUNTER — Inpatient Hospital Stay (HOSPITAL_COMMUNITY): Payer: BC Managed Care – PPO

## 2021-10-04 ENCOUNTER — Inpatient Hospital Stay (HOSPITAL_COMMUNITY)
Admission: AD | Admit: 2021-10-04 | Payer: BC Managed Care – PPO | Source: Home / Self Care | Admitting: Obstetrics & Gynecology

## 2021-10-13 ENCOUNTER — Telehealth (HOSPITAL_COMMUNITY): Payer: Self-pay

## 2021-10-13 NOTE — Telephone Encounter (Signed)
"  I'm doing well, been good. I'm fine. I had some concerns about breastfeeding but it's going better now. My milk has came in. The lactation consultant helped me at the hospital and they told me about the outpatient appointments if I needed it." Patient has no questions or concerns about her healing.  "She's good. We have had a few visits at the doctor and she is doing good. We are all set for her 1 month appointment. She sleeps in a crib next to my bed." RN reviewed ABC's of safe sleep with patient. Patient declines any questions or concerns about baby.  EPDS score is 2.  Sarah Hodge [facility] 10/13/2021,1453

## 2021-10-15 ENCOUNTER — Inpatient Hospital Stay (HOSPITAL_COMMUNITY): Admit: 2021-10-15 | Payer: Self-pay

## 2022-05-12 ENCOUNTER — Encounter: Payer: Self-pay | Admitting: Dermatology

## 2022-05-12 ENCOUNTER — Ambulatory Visit (INDEPENDENT_AMBULATORY_CARE_PROVIDER_SITE_OTHER): Payer: BC Managed Care – PPO | Admitting: Dermatology

## 2022-05-12 DIAGNOSIS — L700 Acne vulgaris: Secondary | ICD-10-CM

## 2022-05-12 MED ORDER — DAPSONE 7.5 % EX GEL: CUTANEOUS | 6 refills | Status: AC

## 2022-06-08 ENCOUNTER — Encounter: Payer: Self-pay | Admitting: Dermatology

## 2022-06-08 NOTE — Progress Notes (Signed)
   Follow-Up Visit   Subjective  Sarah Hodge is a 36 y.o. female who presents for the following: Acne (Acne face only and she is not breast feeding now.).  Acne, facial Location:  Duration:  Quality:  Associated Signs/Symptoms: Modifying Factors:  Severity:  Timing: Context:   Objective  Well appearing patient in no apparent distress; mood and affect are within normal limits. Head - Anterior (Face) Predominantly inflammatory and mostly superficial acne of face.  Essentially all treatment options from topicals to isotretinoin reviewed in detail.  Patient is fine with trying another topical for 2 months; contact us at that time with status report    A focused examination was performed including head and neck.. Relevant physical exam findings are noted in the Assessment and Plan.   Assessment & Plan    Acne vulgaris Head - Anterior (Face)  Accelerated gel apply to areas prone to acne nightly (may substitute generic if out-of-pocket cost is high).  Dapsone (ACZONE) 7.5 % GEL - Head - Anterior (Face) Apply to affected area qd      I, Janalyn Harder, MD, have reviewed all documentation for this visit.  The documentation on 06/08/22 for the exam, diagnosis, procedures, and orders are all accurate and complete.

## 2022-06-08 NOTE — Addendum Note (Signed)
Addended by: Janalyn Harder on: 06/08/2022 05:25 AM   Modules accepted: Level of Service
